# Patient Record
Sex: Male | Born: 1960 | Race: White | Hispanic: No | Marital: Married | Smoking: Never smoker
Health system: Southern US, Community
[De-identification: ages and names within clinical notes are randomized; demographics above are authoritative.]

## PROBLEM LIST (undated history)

## (undated) DIAGNOSIS — K219 Gastro-esophageal reflux disease without esophagitis: Secondary | ICD-10-CM

## (undated) DIAGNOSIS — Z9889 Other specified postprocedural states: Secondary | ICD-10-CM

## (undated) DIAGNOSIS — R112 Nausea with vomiting, unspecified: Secondary | ICD-10-CM

## (undated) DIAGNOSIS — T8859XA Other complications of anesthesia, initial encounter: Secondary | ICD-10-CM

## (undated) DIAGNOSIS — T4145XA Adverse effect of unspecified anesthetic, initial encounter: Secondary | ICD-10-CM

## (undated) HISTORY — DX: Gastro-esophageal reflux disease without esophagitis: K21.9

---

## 2000-11-30 ENCOUNTER — Emergency Department (HOSPITAL_COMMUNITY): Admission: EM | Admit: 2000-11-30 | Discharge: 2000-11-30 | Payer: Self-pay | Admitting: Emergency Medicine

## 2000-11-30 ENCOUNTER — Encounter: Payer: Self-pay | Admitting: Emergency Medicine

## 2001-02-03 ENCOUNTER — Encounter: Payer: Self-pay | Admitting: Internal Medicine

## 2001-02-03 ENCOUNTER — Encounter: Admission: RE | Admit: 2001-02-03 | Discharge: 2001-02-03 | Payer: Self-pay | Admitting: Internal Medicine

## 2001-04-30 ENCOUNTER — Encounter: Payer: Self-pay | Admitting: *Deleted

## 2001-04-30 ENCOUNTER — Encounter: Admission: RE | Admit: 2001-04-30 | Discharge: 2001-04-30 | Payer: Self-pay | Admitting: *Deleted

## 2001-06-04 ENCOUNTER — Encounter (INDEPENDENT_AMBULATORY_CARE_PROVIDER_SITE_OTHER): Payer: Self-pay | Admitting: *Deleted

## 2001-06-04 ENCOUNTER — Ambulatory Visit (HOSPITAL_BASED_OUTPATIENT_CLINIC_OR_DEPARTMENT_OTHER): Admission: RE | Admit: 2001-06-04 | Discharge: 2001-06-05 | Payer: Self-pay | Admitting: Otolaryngology

## 2001-08-06 ENCOUNTER — Encounter (INDEPENDENT_AMBULATORY_CARE_PROVIDER_SITE_OTHER): Payer: Self-pay | Admitting: Specialist

## 2001-08-06 ENCOUNTER — Ambulatory Visit (HOSPITAL_BASED_OUTPATIENT_CLINIC_OR_DEPARTMENT_OTHER): Admission: RE | Admit: 2001-08-06 | Discharge: 2001-08-06 | Payer: Self-pay | Admitting: Otolaryngology

## 2002-11-04 ENCOUNTER — Ambulatory Visit (HOSPITAL_COMMUNITY): Admission: RE | Admit: 2002-11-04 | Discharge: 2002-11-04 | Payer: Self-pay | Admitting: Orthopedic Surgery

## 2003-01-12 ENCOUNTER — Ambulatory Visit (HOSPITAL_COMMUNITY): Admission: RE | Admit: 2003-01-12 | Discharge: 2003-01-12 | Payer: Self-pay | Admitting: Orthopedic Surgery

## 2005-07-28 ENCOUNTER — Encounter: Admission: RE | Admit: 2005-07-28 | Discharge: 2005-07-28 | Payer: Self-pay | Admitting: Internal Medicine

## 2007-04-07 ENCOUNTER — Encounter: Admission: RE | Admit: 2007-04-07 | Discharge: 2007-04-07 | Payer: Self-pay | Admitting: *Deleted

## 2007-04-12 ENCOUNTER — Encounter: Admission: RE | Admit: 2007-04-12 | Discharge: 2007-04-12 | Payer: Self-pay | Admitting: *Deleted

## 2011-01-17 NOTE — Op Note (Signed)
[place]. [facility]  Patient:    MAYS, PAINO Visit Number: [id number] MRN: [id number]          Service Type: DSU Location: [facility] Attending Physician:  Lucky Cowboy Dictated by:   Lucky Cowboy, M.D. Proc. Date: 06/04/01 Admit Date:  06/04/2001   CC:         Darius Bump, M.D.   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  PROCEDURES:  Septoplasty, left maxillary antrotomy with removal of tooth.  SURGEON:  Lucky Cowboy, M.D.  ANESTHESIA:  General.  ESTIMATED BLOOD LOSS:  30 cc.  SPECIMENS:  Left maxillary sinus diseased mucosa.  COMPLICATIONS:  None.  INDICATION:  The patient is a 50 year old male who underwent extraction of a left maxillary molar six months ago.  There was some difficulty with extraction, and the tooth was lodged in the maxillary sinus.  He has had problems with sinusitis since that time and has developed an oroenteral fistula at the extraction site.  A CT scan confirmed these findings with the tooth in the sinus.  FINDINGS:  The patient was noted to have a tooth retained in the left maxillary sinus with severe inflammation and some purulence.  There was pre-existing oroenteral fistula, which was left in place.  There was a prominent left septal deviation, which was causing lateralization of the middle turbinate.  There was an inferior left bony septal spur.  DESCRIPTION OF PROCEDURE:  The patient was taken to the operating room and placed on the table in a supine position.  He was then placed general endotracheal anesthesia and the table rotated counterclockwise 90 degrees. Each nasal cavity was decongested with Afrin on cottonoid sponges.  Lidocaine 1% with 1:100,000 of epinephrine was then used to inject the septum in  the submucoperichondrial plane.  After allowing time for hemostasis, a left hemitransfixion incision was made using a #15 blade.  Mucoperichondrial and mucoperiosteal flaps were elevated using the Therapist, nutritional and suction Cottle.  The bony-cartilaginous junction was divided using a Therapist, nutritional and a contralateral mucoperiosteal flap elevated.  The posterior portion of the quadrangular cartilage and anterior portion of the perpendicular plate of the ethmoid bone and vomer were taken down using an open Jansen-Middleton forceps.  Inferior spur was then dissected out and taken down using pressure to divide the bone with the Therapist, nutritional.  There was no mucosal defect noted.  In this manner, the septum was medialized.  Attention was then turned to the maxillary antrotomy portion of the procedure. The left uncinate process was injected with 1% lidocaine with 1:100,000 of epinephrine.  After allowing time for vasoconstriction, the midportion of the uncinate process was taken down using a pediatric backbiting forceps.  At this point, the microdebrider was used to open up the existing natural antrotomy site.  This was enlarged and inspection using a 30 degree Storz-Hopkins endoscope performed.  Prior to this, a 0 degree Storz-Hopkins endoscope had been used.  Pus and the retained tooth root were extracted from the sinus. The microdebrider was used to take down some of the polypoid tissue in the maxillary sinus; however, the mucosa was not stripped.  At this point, the maxillary sinus was filled with Bactroban ointment and the hemitransfixion incision closed using 4-0 chromic in a simple interrupted fashion.  Doyle splints were placed on either side of the  nasal septum and secured to one another in a horizontal mattress stitch using 2-0 silk suture.  The oral cavity was suctioned and a drip pad placed.  The table was rotated clockwise 90 degrees to its original position.  The patient was  awakened from anesthesia and taken to the postanesthesia care unit in stable condition.  There were no complications.  Return appointment is on June 08, 2001, at 1:30 p.m. for splint removal. Dictated by:   Lucky Cowboy, M.D. Attending Physician:  Lucky Cowboy DD:  06/04/01 TD:  06/05/01 Job: [id number]

## 2011-01-17 NOTE — Op Note (Signed)
   NAME:  Harry Reynolds, Harry Reynolds NO.:  [id number]   MEDICAL RECORD NO.:  [id number]                   PATIENT TYPE:  AMB   LOCATION:  DAY                                  FACILITY:  [facility]   PHYSICIAN:  Marlowe Kays, M.D.               DATE OF BIRTH:  [birth year]   DATE OF PROCEDURE:  11/04/2002  DATE OF DISCHARGE:                                 OPERATIVE REPORT   PREOPERATIVE DIAGNOSIS:  Left carpal tunnel syndrome.   POSTOPERATIVE DIAGNOSIS:  Left carpal tunnel syndrome.   OPERATION:  Decompression of median nerve, left wrist and hand.   SURGEON:  Marlowe Kays, M.D.   ASSISTANT:  Nurse.   ANESTHESIA:  IV regional.   PATHOLOGY AND INDICATIONS FOR PROCEDURE:  Findings and symptoms of severe  carpal tunnel syndrome, confirmed by a nerve conduction study.  She also has  carpal tunnel syndrome on the right side, but only the left is being done  today.   DESCRIPTION OF PROCEDURE:  Satisfactory IV regional anesthesia, Duraprep  from mid forearm to fingertips and then was draped in a sterile field.  With  the marking pen I marked out the curved incision along the base of thenar  imminence, crossing obliquely over flexor crease of the wrist and the distal  forearm.  The palmaris longus tendon was identified and retracted  radialward.  The median nerve was identified beneath it and just proximal to  the wrist there is significant compression of the nerve.  The skin,  subcutaneous tissue and fascia were progressively released into the distal  palm.  The patient also had significant compression in the mid palm.  The  vascular arcade in the mid palm was carefully dissected out with a small  hemostat.  Potential bleeders were coagulated with bipolar cautery  throughout the case.   At the conclusion of the decompression I irrigated the wound well with  sterile saline.  The skin and subcutaneous tissue I closed only with  interrupted 4-0 nylon mattress  sutures.  Betadine, Adaptic dry sterile  dressing and volar plaster splint were applied.  The tourniquet was  released.  He tolerated the procedure well and returned to the recovery room  in satisfactory condition, with no known complications.                                               Marlowe Kays, M.D.    JA/MEDQ  D:  11/04/2002  T:  11/05/2002  Job:  [id number]

## 2011-01-17 NOTE — Op Note (Signed)
   NAME:  Harry Reynolds, Harry Reynolds NO.:  [id number]   MEDICAL RECORD NO.:  [id number]                   PATIENT TYPE:  AMB   LOCATION:  DAY                                  [facility]ACILITY:  [facility]   PHYSICIAN:  Marlowe Kays, M.D.               DATE OF BIRTH:  [birth year]   DATE OF PROCEDURE:  01/12/2003  DATE OF DISCHARGE:                                 OPERATIVE REPORT   PREOPERATIVE DIAGNOSIS:  Right carpal tunnel syndrome, status post left  carpal tunnel release.   POSTOPERATIVE DIAGNOSIS:  Right carpal tunnel syndrome, status post left  carpal tunnel release.   OPERATION:  Decompression of the median nerve, right wrist and hand.   SURGEON:  Illene Labrador. [place], M.D.   ASSISTANT:  Nurse.   ANESTHESIA:  IV regional.   PATHOLOGY AND JUSTIFICATION FOR PROCEDURE:  He has had complete relief with  the release on the left but has numbness and pain consistent with carpal  tunnel syndrome on the right with findings confirmed by abnormal nerve  conduction studies.   DESCRIPTION OF PROCEDURE:  Satisfactory IV regional anesthesia, DuraPrep  from mid forearm to fingertips, and was draped in a sterile field.  I marked  out a curved incision along the base of the thenar eminence, crossing  briefly at the flexor crease of the wrist and the distal forearm.  There was  no palmaris longus tendon identified.  The median nerve had compression  proximal to the wrist as evidenced by loss of vascularity in the nerve.  He  also had significant compression in the mid palm.  Potential bleeders were  coagulated with bipolar cautery.  As I released the skin and fascia into the  distal hand, gently dissecting out the individual branches of the median  nerve.  When I felt that decompression had been completed, I irrigated the  wound well with sterile saline and closed the skin and subcutaneous tissue  only with interrupted 4-0 nylon mattress sutures.  Betadine, Adaptic dry  sterile  dressings, and volar plaster splint were applied.  He tolerated the  procedure well and was taken to the recovery room in satisfactory condition  with no known complications.                                               Marlowe Kays, M.D.    JA/MEDQ  D:  01/12/2003  T:  01/12/2003  Job:  [id number]

## 2011-01-17 NOTE — Op Note (Signed)
[place]. [facility]  Patient:    Harry Reynolds, Harry Reynolds Visit Number: [id number] MRN: [id number]          Service Type: DSU Location: [facility] Attending Physician:  Lucky Cowboy Dictated by:   Lucky Cowboy, M.D. Proc. Date: 08/06/01 Admit Date:  08/06/2001   CC:         Darius Bump, M.D., [facility]   Operative Report  PREOPERATIVE DIAGNOSIS:  Left oroantral fistula.  POSTOPERATIVE DIAGNOSIS:  left oroantral fistula.  PROCEDURE:  Closure of left oroantral fistula with rotation of left palate flap.  SURGEON:  Lucky Cowboy, M.D.  ANESTHESIA:  General endotracheal anesthesia.  ESTIMATED BLOOD LOSS:  300 cc.  SPECIMENS:  Debridement from oroantral fistula site.  COMPLICATIONS:  None.  INDICATION:  The patient is a 50 year old male who has a left oroantral fistula for nine months following dental extraction.  He has since undergone maxillary antrotomy for chronic sinusitis on that side.  He is now ready for closure of the oroantral fistula.  FINDINGS:  The patient was noted to have approximately an 8 mm oroantral bony defect.  Necrotic mucosa, which was inflamed, was removed from the site.  A liquid bone-type replacement was used to fill the defect.  DESCRIPTION OF PROCEDURE:  The patient was taken to the operating room and placed on the table in a supine position.  He was then placed under general endotracheal anesthesia and the table rotated counterclockwise 90 degrees. Bacitracin ointment was placed on the lips and the head and body draped in the usual fashion.  A bite block was placed between the right molars.  The tongue was reflected inferiorly.  A gauze was placed in the oropharynx.  Lidocaine 1% with 1:100,000 of epinephrine was then used to inject the area around the fistula site and also the left side of the hard palate.  After allowing time for vasoconstriction, a #15 and then a #11 blade was then used to make  a circumferential excision of mucosa around the bony defect site.  The mucosa in the sinus was also debrided, as it was felt to be quite edematous.  There was no pus encountered.  Palate flap was then elevated.  It was based on the greater palatine artery.  It comprised almost the entire left portion of the hard palate.  This was incised using a #11 blade.  The flap was then elevated down to the greater palatine foramen using a Public house manager.  Prior to rotating the flap in place, a back-cut was required along the lateral posterior rotation point.  This was performed to prevent bulking up of the tissue in that area.  A calcium sulfate mixture was then placed in the defect site.  The trade name is [id number]-[id number].  This is a bone graft type of material.  It was allowed to solidify within the defect site.  The graft was then placed into position with stay sutures using 4-0 Vicryl.  It was then secured to the surrounding buccal and gingival mucosa.  The oral cavity was then suctioned out.  An NG tube was then placed down the esophagus for suctioning of the gastric contents.  The patient was then awakened from anesthesia and taken to the postanesthesia care unit in stable condition.  There were no complications. Dictated by:   Lucky Cowboy, M.D. Attending Physician:  Lucky Cowboy DD:  08/06/01 TD:  08/07/01 Job: [id number]

## 2013-05-20 ENCOUNTER — Encounter (HOSPITAL_COMMUNITY): Payer: Self-pay | Admitting: *Deleted

## 2013-05-20 ENCOUNTER — Emergency Department (HOSPITAL_COMMUNITY)
Admission: EM | Admit: 2013-05-20 | Discharge: 2013-05-20 | Disposition: A | Discharge status: Home / Self Care | Payer: BC Managed Care – PPO | Attending: Emergency Medicine | Admitting: Emergency Medicine

## 2013-05-20 ENCOUNTER — Emergency Department (HOSPITAL_COMMUNITY): Payer: BC Managed Care – PPO

## 2013-05-20 DIAGNOSIS — IMO0002 Reserved for concepts with insufficient information to code with codable children: Secondary | ICD-10-CM | POA: Insufficient documentation

## 2013-05-20 DIAGNOSIS — Y92410 Unspecified street and highway as the place of occurrence of the external cause: Secondary | ICD-10-CM | POA: Insufficient documentation

## 2013-05-20 DIAGNOSIS — Y9389 Activity, other specified: Secondary | ICD-10-CM | POA: Insufficient documentation

## 2013-05-20 DIAGNOSIS — S298XXA Other specified injuries of thorax, initial encounter: Secondary | ICD-10-CM | POA: Insufficient documentation

## 2013-05-20 DIAGNOSIS — R0789 Other chest pain: Secondary | ICD-10-CM

## 2013-05-20 DIAGNOSIS — S86912A Strain of unspecified muscle(s) and tendon(s) at lower leg level, left leg, initial encounter: Secondary | ICD-10-CM

## 2013-05-20 MED ORDER — CYCLOBENZAPRINE HCL 10 MG PO TABS: 10.0000 mg | ORAL_TABLET | Freq: Two times a day (BID) | ORAL | Status: DC | PRN

## 2013-05-20 MED ORDER — HYDROCODONE-ACETAMINOPHEN 5-325 MG PO TABS: 1.0000 | ORAL_TABLET | ORAL | Status: DC | PRN

## 2013-05-20 MED ORDER — HYDROCODONE-ACETAMINOPHEN 5-325 MG PO TABS
1.0000 | ORAL_TABLET | Freq: Once | ORAL | Status: AC
  Administered 2013-05-20: 1 via ORAL
  Filled 2013-05-20: qty 1

## 2013-05-20 NOTE — ED Provider Notes (Signed)
Medical screening examination/treatment/procedure(s) were performed by non-physician practitioner and as supervising physician I was immediately available for consultation/collaboration.  Deren Degrazia, MD 05/20/13 2028 
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
## 2013-05-20 NOTE — ED Notes (Signed)
Pt seen and evaluated by EDPa for initial assessment. 

## 2013-05-25 ENCOUNTER — Emergency Department (HOSPITAL_COMMUNITY)
Admission: EM | Admit: 2013-05-25 | Discharge: 2013-05-25 | Disposition: A | Discharge status: Home / Self Care | Payer: BC Managed Care – PPO | Attending: Emergency Medicine | Admitting: Emergency Medicine

## 2013-05-25 ENCOUNTER — Emergency Department (HOSPITAL_COMMUNITY): Payer: BC Managed Care – PPO

## 2013-05-25 ENCOUNTER — Encounter (HOSPITAL_COMMUNITY): Payer: Self-pay | Admitting: Emergency Medicine

## 2013-05-25 DIAGNOSIS — G8911 Acute pain due to trauma: Secondary | ICD-10-CM | POA: Insufficient documentation

## 2013-05-25 DIAGNOSIS — M25512 Pain in left shoulder: Secondary | ICD-10-CM

## 2013-05-25 DIAGNOSIS — M25519 Pain in unspecified shoulder: Secondary | ICD-10-CM | POA: Insufficient documentation
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
## 2013-05-25 NOTE — ED Notes (Signed)
Pt seen and evaluated by EDPa for initial assessment. 
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
## 2013-05-26 NOTE — ED Provider Notes (Signed)
Medical screening examination/treatment/procedure(s) were performed by non-physician practitioner and as supervising physician I was immediately available for consultation/collaboration.   Benny Lennert, MD 05/26/13 1415

## 2014-07-29 ENCOUNTER — Emergency Department (HOSPITAL_COMMUNITY)
Admission: EM | Admit: 2014-07-29 | Discharge: 2014-07-29 | Disposition: A | Discharge status: Home / Self Care | Payer: BC Managed Care – PPO | Attending: Emergency Medicine | Admitting: Emergency Medicine

## 2014-07-29 ENCOUNTER — Encounter (HOSPITAL_COMMUNITY)
Admission: EM | Disposition: A | Discharge status: Home / Self Care | Payer: Self-pay | Source: Home / Self Care | Attending: Emergency Medicine

## 2014-07-29 ENCOUNTER — Encounter (HOSPITAL_COMMUNITY): Payer: Self-pay | Admitting: *Deleted

## 2014-07-29 DIAGNOSIS — Z885 Allergy status to narcotic agent status: Secondary | ICD-10-CM | POA: Diagnosis not present

## 2014-07-29 DIAGNOSIS — T18128A Food in esophagus causing other injury, initial encounter: Secondary | ICD-10-CM | POA: Diagnosis not present

## 2014-07-29 DIAGNOSIS — W44F3XA Food entering into or through a natural orifice, initial encounter: Secondary | ICD-10-CM | POA: Insufficient documentation

## 2014-07-29 DIAGNOSIS — X58XXXA Exposure to other specified factors, initial encounter: Secondary | ICD-10-CM | POA: Insufficient documentation

## 2014-07-29 DIAGNOSIS — Y929 Unspecified place or not applicable: Secondary | ICD-10-CM | POA: Insufficient documentation

## 2014-07-29 DIAGNOSIS — T18108A Unspecified foreign body in esophagus causing other injury, initial encounter: Secondary | ICD-10-CM

## 2014-07-29 DIAGNOSIS — K219 Gastro-esophageal reflux disease without esophagitis: Secondary | ICD-10-CM | POA: Diagnosis not present

## 2014-07-29 DIAGNOSIS — R1310 Dysphagia, unspecified: Secondary | ICD-10-CM | POA: Diagnosis present

## 2014-07-29 DIAGNOSIS — Z79899 Other long term (current) drug therapy: Secondary | ICD-10-CM | POA: Diagnosis not present

## 2014-07-29 HISTORY — PX: ESOPHAGOGASTRODUODENOSCOPY: SHX5428

## 2014-07-29 SURGERY — EGD (ESOPHAGOGASTRODUODENOSCOPY)
Anesthesia: Moderate Sedation

## 2014-07-29 SURGERY — CANCELLED PROCEDURE

## 2014-07-29 MED ORDER — ONDANSETRON HCL 4 MG/2ML IJ SOLN
INTRAMUSCULAR | Status: DC | PRN
  Administered 2014-07-29: 4 mg via INTRAVENOUS

## 2014-07-29 MED ORDER — SODIUM CHLORIDE 0.9 % IV SOLN
INTRAVENOUS | Status: DC
  Administered 2014-07-29: 1000 mL via INTRAVENOUS

## 2014-07-29 MED ORDER — FENTANYL CITRATE 0.05 MG/ML IJ SOLN
50.0000 ug | Freq: Once | INTRAMUSCULAR | Status: AC
  Administered 2014-07-29: 50 ug via INTRAVENOUS
  Filled 2014-07-29: qty 2

## 2014-07-29 MED ORDER — MEPERIDINE HCL 100 MG/ML IJ SOLN
INTRAMUSCULAR | Status: AC
  Filled 2014-07-29: qty 2

## 2014-07-29 MED ORDER — MIDAZOLAM HCL 5 MG/5ML IJ SOLN
INTRAMUSCULAR | Status: DC | PRN
  Administered 2014-07-29: 2 mg via INTRAVENOUS

## 2014-07-29 MED ORDER — STERILE WATER FOR IRRIGATION IR SOLN
Status: DC | PRN
  Administered 2014-07-29: 21:00:00

## 2014-07-29 MED ORDER — MEPERIDINE HCL 100 MG/ML IJ SOLN
INTRAMUSCULAR | Status: DC | PRN
  Administered 2014-07-29: 50 mg via INTRAVENOUS

## 2014-07-29 MED ORDER — BUTAMBEN-TETRACAINE-BENZOCAINE 2-2-14 % EX AERO
INHALATION_SPRAY | CUTANEOUS | Status: DC | PRN
  Administered 2014-07-29: 3 via TOPICAL

## 2014-07-29 MED ORDER — LIDOCAINE VISCOUS 2 % MT SOLN
OROMUCOSAL | Status: AC
  Filled 2014-07-29: qty 15

## 2014-07-29 MED ORDER — ONDANSETRON HCL 4 MG/2ML IJ SOLN
INTRAMUSCULAR | Status: AC
  Filled 2014-07-29: qty 2

## 2014-07-29 MED ORDER — MIDAZOLAM HCL 5 MG/5ML IJ SOLN
INTRAMUSCULAR | Status: AC
  Filled 2014-07-29: qty 10

## 2014-07-29 NOTE — ED Notes (Signed)
Pt informed that Dr Gala Romney is coming in.

## 2014-07-29 NOTE — ED Notes (Signed)
Pt states was eating some beef about 2 hours ago & now has something stuck in throat. Pt attempted to drink & vomited as soon as he drink it. Pt states feel like it stays in the same place.

## 2014-07-29 NOTE — Discharge Instructions (Addendum)
EGD Discharge instructions Please read the instructions outlined below and refer to this sheet in the next few weeks. These discharge instructions provide you with general information on caring for yourself after you leave the hospital. Your doctor may also give you specific instructions. While your treatment has been planned according to the most current medical practices available, unavoidable complications occasionally occur. If you have any problems or questions after discharge, please call your doctor. ACTIVITY  You may resume your regular activity but move at a slower pace for the next 24 hours.   Take frequent rest periods for the next 24 hours.   Walking will help expel (get rid of) the air and reduce the bloated feeling in your abdomen.   No driving for 24 hours (because of the anesthesia (medicine) used during the test).   You may shower.   Do not sign any important legal documents or operate any machinery for 24 hours (because of the anesthesia used during the test).  NUTRITION  Drink plenty of fluids.   You may resume your normal diet.   Begin with a light meal and progress to your normal diet.   Avoid alcoholic beverages for 24 hours or as instructed by your caregiver.  MEDICATIONS  You may resume your normal medications unless your caregiver tells you otherwise.  WHAT YOU CAN EXPECT TODAY  You may experience abdominal discomfort such as a feeling of fullness or gas pains.  FOLLOW-UP  Your doctor will discuss the results of your test with you.  SEEK IMMEDIATE MEDICAL ATTENTION IF ANY OF THE FOLLOWING OCCUR:  Excessive nausea (feeling sick to your stomach) and/or vomiting.   Severe abdominal pain and distention (swelling).   Trouble swallowing.   Temperature over 101 F (37.8 C).   Rectal bleeding or vomiting of blood.    GERD information provided  Soft diet until you can have elective EGD and have your esophagus dilated as appropriate. Would avoid meat  and bread for now.  My office will contact you regarding scheduling an elective endoscopy in the coming weeks.  Gastroesophageal Reflux Disease, Adult Gastroesophageal reflux disease (GERD) happens when acid from your stomach flows up into the esophagus. When acid comes in contact with the esophagus, the acid causes soreness (inflammation) in the esophagus. Over time, GERD may create small holes (ulcers) in the lining of the esophagus. CAUSES   Increased body weight. This puts pressure on the stomach, making acid rise from the stomach into the esophagus.  Smoking. This increases acid production in the stomach.  Drinking alcohol. This causes decreased pressure in the lower esophageal sphincter (valve or ring of muscle between the esophagus and stomach), allowing acid from the stomach into the esophagus.  Late evening meals and a full stomach. This increases pressure and acid production in the stomach.  A malformed lower esophageal sphincter. Sometimes, no cause is found. SYMPTOMS   Burning pain in the lower part of the mid-chest behind the breastbone and in the mid-stomach area. This may occur twice a week or more often.  Trouble swallowing.  Sore throat.  Dry cough.  Asthma-like symptoms including chest tightness, shortness of breath, or wheezing. DIAGNOSIS  Your caregiver may be able to diagnose GERD based on your symptoms. In some cases, X-rays and other tests may be done to check for complications or to check the condition of your stomach and esophagus. TREATMENT  Your caregiver may recommend over-the-counter or prescription medicines to help decrease acid production. Ask your caregiver before starting  or adding any new medicines.  HOME CARE INSTRUCTIONS   Change the factors that you can control. Ask your caregiver for guidance concerning weight loss, quitting smoking, and alcohol consumption.  Avoid foods and drinks that make your symptoms worse, such as:  Caffeine or  alcoholic drinks.  Chocolate.  Peppermint or mint flavorings.  Garlic and onions.  Spicy foods.  Citrus fruits, such as oranges, lemons, or limes.  Tomato-based foods such as sauce, chili, salsa, and pizza.  Fried and fatty foods.  Avoid lying down for the 3 hours prior to your bedtime or prior to taking a nap.  Eat small, frequent meals instead of large meals.  Wear loose-fitting clothing. Do not wear anything tight around your waist that causes pressure on your stomach.  Raise the head of your bed 6 to 8 inches with wood blocks to help you sleep. Extra pillows will not help.  Only take over-the-counter or prescription medicines for pain, discomfort, or fever as directed by your caregiver.  Do not take aspirin, ibuprofen, or other nonsteroidal anti-inflammatory drugs (NSAIDs). SEEK IMMEDIATE MEDICAL CARE IF:   You have pain in your arms, neck, jaw, teeth, or back.  Your pain increases or changes in intensity or duration.  You develop nausea, vomiting, or sweating (diaphoresis).  You develop shortness of breath, or you faint.  Your vomit is green, yellow, black, or looks like coffee grounds or blood.  Your stool is red, bloody, or black. These symptoms could be signs of other problems, such as heart disease, gastric bleeding, or esophageal bleeding. MAKE SURE YOU:   Understand these instructions.  Will watch your condition.  Will get help right away if you are not doing well or get worse. Document Released: 05/28/2005 Document Revised: 11/10/2011 Document Reviewed: 03/07/2011 [facility] Patient Information 2015 [place], [place]. This information is not intended to replace advice given to you by your health care provider. Make sure you discuss any questions you have with your health care provider.  Soft-Food Meal Plan A soft-food meal plan includes foods that are safe and easy to swallow. This meal plan typically is used:  If you are having trouble chewing or  swallowing foods.  As a transition meal plan after only having had liquid meals for a long period. WHAT DO I NEED TO KNOW ABOUT THE SOFT-FOOD MEAL PLAN? A soft-food meal plan includes tender foods that are soft and easy to chew and swallow. In most cases, bite-sized pieces of food are easier to swallow. A bite-sized piece is about  inch or smaller. Foods in this plan do not need to be ground or pureed. Foods that are very hard, crunchy, or sticky should be avoided. Also, breads, cereals, yogurts, and desserts with nuts, seeds, or fruits should be avoided. WHAT FOODS CAN I EAT? Grains Rice and wild rice. Moist bread, dressing, pasta, and noodles. Well-moistened dry or cooked cereals, such as farina (cooked wheat cereal), oatmeal, or grits. Biscuits, breads, muffins, pancakes, and waffles that have been well moistened. Vegetables Shredded lettuce. Cooked, tender vegetables, including potatoes without skins. Vegetable juices. Broths or creamed soups made with vegetables that are not stringy or chewy. Strained tomatoes (without seeds). Fruits Canned or well-cooked fruits. Soft (ripe), peeled fresh fruits, such as peaches, nectarines, kiwi, cantaloupe, honeydew melon, and watermelon (without seeds). Soft berries with small seeds, such as strawberries. Fruit juices (without pulp). Meats and Other Protein Sources Moist, tender, lean beef. Mutton. Lamb. Veal. Chicken. [place]. Liver. Ham. Fish without bones. Eggs. Dairy Milk,  milk drinks, and cream. Plain cream cheese and cottage cheese. Plain yogurt. Sweets/Desserts Flavored gelatin desserts. Custard. Plain ice cream, frozen yogurt, sherbet, milk shakes, and malts. Plain cakes and cookies. Plain hard candy.  Other Butter, margarine (without trans fat), and cooking oils. Mayonnaise. Cream sauces. Mild spices, salt, and sugar. Syrup, molasses, honey, and jelly. The items listed above may not be a complete list of recommended foods or beverages. Contact  your dietitian for more options. WHAT FOODS ARE NOT RECOMMENDED? Grains Dry bread, toast, crackers that have not been moistened. Coarse or dry cereals, such as bran, granola, and shredded wheat. Tough or chewy crusty breads, such as [place] bread or baguettes. Vegetables Corn. Raw vegetables except shredded lettuce. Cooked vegetables that are tough or stringy. Tough, crisp, fried potatoes and potato skins. Fruits Fresh fruits with skins or seeds or both, such as apples, pears, or grapes. Stringy, high-pulp fruits, such as papaya, pineapple, coconut, or mango. Fruit leather, fruit roll-ups, and all dried fruits. Meats and Other Protein Sources Sausages and hot dogs. Meats with gristle. Fish with bones. Nuts, seeds, and chunky peanut or other nut butters. Sweets/Desserts Cakes or cookies that are very dry or chewy.  The items listed above may not be a complete list of foods and beverages to avoid. Contact your dietitian for more information. Document Released: 11/25/2007 Document Revised: 08/23/2013 Document Reviewed: 07/15/2013 [employer] Patient Information 2015 [place], [place]. This information is not intended to replace advice given to you by your health care provider. Make sure you discuss any questions you have with your health care provider.

## 2014-07-29 NOTE — ED Provider Notes (Signed)
CSN: [id number]     Arrival date & time 07/29/14  1931 History   First MD Initiated Contact with Patient 07/29/14 1939     Chief Complaint  Patient presents with  . Dysphagia      HPI Pt was seen at [facility]. Per pt and his family, c/o sudden onset and persistence of constant FB in esophagus that began approximately 2 hours PTA. Pt states he was "eating beef" when "it got stuck" in his lower chest area. States he is unable to tol PO: "it just comes up again." States he has experienced similar symptoms previously but "was able to pass it on my own." States he has had "problems with reflux" for over the past year, frequently taking OTC Tums to treat his symptoms.  Pt has not been evaluated by a PMD or GI MD for his symptoms. Denies syncope, no hematemesis, no black or blood in stools, no SOB.    History reviewed. No pertinent past medical history.   History reviewed. No pertinent past surgical history.  History  Substance Use Topics  . Smoking status: Never Smoker   . Smokeless tobacco: Never Used  . Alcohol Use: No    Review of Systems ROS: Statement: All systems negative except as marked or noted in the HPI; Constitutional: Negative for fever and chills. ; ; Eyes: Negative for eye pain, redness and discharge. ; ; ENMT: Negative for ear pain, hoarseness, nasal congestion, sinus pressure and sore throat. ; ; Cardiovascular: Negative for chest pain, palpitations, diaphoresis, dyspnea and peripheral edema. ; ; Respiratory: Negative for cough, wheezing and stridor. ; ; Gastrointestinal: +FB esophagus. Negative for nausea, vomiting, diarrhea, abdominal pain, blood in stool, hematemesis, jaundice and rectal bleeding. . ; ; Genitourinary: Negative for dysuria, flank pain and hematuria. ; ; Musculoskeletal: Negative for back pain and neck pain. Negative for swelling and trauma.; ; Skin: Negative for pruritus, rash, abrasions, blisters, bruising and skin lesion.; ; Neuro: Negative for headache,  lightheadedness and neck stiffness. Negative for weakness, altered level of consciousness , altered mental status, extremity weakness, paresthesias, involuntary movement, seizure and syncope.     Allergies  Codeine and Other  Home Medications   Prior to Admission medications   Medication Sig Start Date End Date Taking? Authorizing Provider  cyclobenzaprine (FLEXERIL) 10 MG tablet Take 1 tablet (10 mg total) by mouth 2 (two) times daily as needed for muscle spasms. Patient not taking: Reported on 07/29/2014 05/20/13   Idalia Needle. Sanford, PA-C   BP 127/111 mmHg  Pulse 92  Temp(Src) 97.8 F (36.6 C) (Oral)  Resp 20  Ht 5\' 8"  (1.727 m)  Wt 199 lb (90.266 kg)  BMI 30.26 kg/m2  SpO2 98% Physical Exam  1950: Physical examination:  Nursing notes reviewed; Vital signs and O2 SAT reviewed;  Constitutional: Well developed, Well nourished, Well hydrated, Uncomfortable appearing.; Head:  Normocephalic, atraumatic; Eyes: EOMI, PERRL, No scleral icterus; ENMT: Mouth and pharynx normal, Mucous membranes moist; Neck: Supple, Full range of motion, No lymphadenopathy; Cardiovascular: Regular rate and rhythm, No murmur, rub, or gallop; Respiratory: Breath sounds clear & equal bilaterally, No rales, rhonchi, wheezes.  Speaking full sentences with ease, Normal respiratory effort/excursion; Chest: Nontender, Movement normal; Abdomen: Soft, Nontender, Nondistended, Normal bowel sounds; Genitourinary: No CVA tenderness; Extremities: Pulses normal, No tenderness, No edema, No calf edema or asymmetry.; Neuro: AA&Ox3, Major CN grossly intact.  Speech clear. No gross focal motor or sensory deficits in extremities. Climbs on and off stretcher easily by himself. Gait steady.;  Skin: Color normal, Warm, Dry.   ED Course  Procedures    MDM  MDM Reviewed: nursing note and vitals     2000:  Pt took a sip of water and immediately regurgitated it.  T/C to GI Dr. Gala Romney, case discussed, including:  HPI, pertinent  PM/SHx, VS/PE, dx testing, ED course and treatment:  Agreeable to perform EGD tonight.  Pt and family updated.    Francine Graven, DO 07/31/14 [phone]

## 2014-07-29 NOTE — H&P (Signed)
@LOGO @   Primary Care Physician:  No PCP Per Patient Primary Gastroenterologist:  Dr. Gala Romney  Pre-Procedure History & Physical: HPI:  Harry Reynolds is a 53 y.o. male here for probable food impaction. Was eating meat 4 hours ago swallowed his first piece and felt it stick minus breast bone. Has not been able to swallow his saliva since. Dr. Thurnell Garbe saw him in the ED and called me. He's had long-standing intermittent issues with esophageal dysphagia. An long, long history of GERD. Takes Tums regularly. Does not see any physicians. No prior GI evaluation. No prior history of gastrointestinal illness as far she knows.  No PCP.  History reviewed. No pertinent past medical history.  History reviewed. No pertinent past surgical history.  Prior to Admission medications   Medication Sig Start Date End Date Taking? Authorizing Provider  cyclobenzaprine (FLEXERIL) 10 MG tablet Take 1 tablet (10 mg total) by mouth 2 (two) times daily as needed for muscle spasms. Patient not taking: Reported on 07/29/2014 05/20/13   Idalia Needle. Sanford, PA-C    Allergies as of 07/29/2014 - Review Complete 07/29/2014  Allergen Reaction Noted  . Codeine Nausea And Vomiting 05/20/2013  . Other Nausea And Vomiting 07/29/2014    History reviewed. No pertinent family history.  History   Social History  . Marital Status: Married    Spouse Name: N/A    Number of Children: N/A  . Years of Education: N/A   Occupational History  . Not on file.   Social History Main Topics  . Smoking status: Never Smoker   . Smokeless tobacco: Never Used  . Alcohol Use: No  . Drug Use: No  . Sexual Activity: Not on file   Other Topics Concern  . Not on file   Social History Narrative    Review of Systems: See HPI, otherwise negative ROS  Physical Exam: BP 127/111 mmHg  Pulse 92  Temp(Src) 97.8 F (36.6 C) (Oral)  Resp 20  Ht 5\' 8"  (1.727 m)  Wt 199 lb (90.266 kg)  BMI 30.26 kg/m2  SpO2 98% General:   Alert,  ,  pleasant and cooperative in NAD. Expectorating saliva into an emesis basin. Skin:  Intact without significant lesions or rashes. Eyes:  Sclera clear, no icterus.   Conjunctiva pink. Ears:  Normal auditory acuity. Nose:  No deformity, discharge,  or lesions. Mouth:  No deformity or lesions. Neck:  Supple; no masses or thyromegaly. No significant cervical adenopathy. Lungs:  Clear throughout to auscultation.   No wheezes, crackles, or rhonchi. No acute distress. Heart:  Regular rate and rhythm; no murmurs, clicks, rubs,  or gallops. Abdomen: Non-distended, normal bowel sounds.  Soft and nontender without appreciable mass or hepatosplenomegaly.  Pulses:  Normal pulses noted. Extremities:  Without clubbing or edema.  Impression/Plan: 53 year old gentleman with the signs and symptoms consistent with esophageal food impaction. Has chronic esophageal dysphagia. Has frequent GERD  -   long-standing with no prior workup. He likely has a food impaction.  I've offered him an emergent EGD with disimpaction. I told him I would not likely be able to dilate his esophagus this evening. The plan would be for him to return in elective setting to complete an EGD and dilated esophagus as appropriate.The risks, benefits, limitations, alternatives and imponderables have been reviewed with the patient. Potential for esophageal dilation, biopsy, etc. have also been reviewed.  Questions have been answered. All parties agreeable.    Notice: This dictation was prepared with Dragon dictation along with  smaller phrase technology. Any transcriptional errors that result from this process are unintentional and may not be corrected upon review.

## 2014-07-29 NOTE — ED Notes (Signed)
Pt c/o difficulty swallowing; pt states he ate a piece of meat and it's stuck; pt states he has had the same problem in the past

## 2014-07-31 NOTE — Op Note (Signed)
[facility] [address] [place], [address]   ENDOSCOPY PROCEDURE REPORT  PATIENT: Harry Reynolds, Harry Reynolds  MR#: [id number] BIRTHDATE: 1960/09/06 , 53  yrs. old GENDER: male ENDOSCOPIST: R.  Garfield Cornea, MD FACP [facility] REFERRED BY:     none PROCEDURE DATE:  08-20-2014 PROCEDURE:  EGD w/ fb removal INDICATIONS:  food impaction. MEDICATIONS: Versed 2 mg IV and Demerol 50 mg IV in divided doses. Zofran 4 mg IV.  Cetacaine spray orally. ASA CLASS:      Class II  CONSENT: The risks, benefits, limitations, alternatives and imponderables have been discussed.  The potential for biopsy, esophogeal dilation, etc. have also been reviewed.  Questions have been answered.  All parties agreeable.  Please see the history and physical in the medical record for more information.  DESCRIPTION OF PROCEDURE: After the risks benefits and alternatives of the procedure were thoroughly explained, informed consent was obtained.  The [id number] ([id number]) endoscope was introduced through the mouth andum limited by Limited by an obstruction. .    Meat bolus ball valving in the distal esophagus.  There is a question of a ringed appearance and possible furring of the distal esophagus.  I gently placed the tip of the gastroscope against the meat bolus and applied gentle pressure, pushing it into the stomach.  No attempts at complete examination carried out this evening per plan.  Retroflexion was not performed.     The scope was then withdrawn from the patient and the procedure completed.  COMPLICATIONS: There were no immediate complications.  ENDOSCOPIC IMPRESSION: Limited EGD; food impaction; status post disimpaction as described above.  RECOMMENDATIONS: Begin Protonix 40 mg daily. Avoid meats and bread. Swallowing precautions reviewed. Will invite him back for elective EGD with dilation, etc. as appropriate.  REPEAT EXAM:  eSigned:  R. Garfield Cornea, MD Rosalita Chessman [facility] 2014/08/20 9:32  PM    CC:  CPT CODES: ICD CODES:  The ICD and CPT codes recommended by this software are interpretations from the data that the clinical staff has captured with the software.  The verification of the translation of this report to the ICD and CPT codes and modifiers is the sole responsibility of the health care institution and practicing physician where this report was generated.  [employer]. will not be held responsible for the validity of the ICD and CPT codes included on this report.  AMA assumes no liability for data contained or not contained herein. CPT is a Designer, television/film set of the [employer].

## 2014-08-01 ENCOUNTER — Other Ambulatory Visit: Payer: Self-pay

## 2014-08-01 DIAGNOSIS — K2100 Gastro-esophageal reflux disease with esophagitis, without bleeding: Secondary | ICD-10-CM

## 2014-08-01 DIAGNOSIS — Z1211 Encounter for screening for malignant neoplasm of colon: Secondary | ICD-10-CM

## 2014-08-01 MED ORDER — PEG-KCL-NACL-NASULF-NA ASC-C 100 G PO SOLR: 1.0000 | ORAL | Status: DC

## 2014-08-02 ENCOUNTER — Telehealth: Payer: Self-pay

## 2014-08-02 NOTE — Telephone Encounter (Signed)
Instructions are in the mail and patient is aware of date and time.

## 2014-08-02 NOTE — Telephone Encounter (Signed)
-----   Message from Daneil Dolin, MD sent at 08/01/2014  9:40 PM EST ----- yes ----- Message -----    From: Marlou Porch, CMA    Sent: 08/01/2014   3:25 PM      To: Daneil Dolin, MD  I have him set up for 08/14/14 to do the EGD and TCS. Would the TCS before a screening?  Thanks Saffron Busey  ----- Message -----    From: Daneil Dolin, MD    Sent: 07/29/2014   9:39 PM      To: Marlou Porch, CMA  Patient in this evening with food impaction. He needs an elective EGD in about 2 weeks to further assess and dilate as appropriate. He is 89 and never had a colonoscopy. I would offer him a colonoscopy at the same time. Thanks.

## 2014-08-04 ENCOUNTER — Encounter (HOSPITAL_COMMUNITY): Payer: Self-pay | Admitting: Internal Medicine

## 2014-08-14 ENCOUNTER — Encounter (HOSPITAL_COMMUNITY)
Admission: RE | Disposition: A | Discharge status: Home / Self Care | Payer: Self-pay | Source: Ambulatory Visit | Attending: Internal Medicine

## 2014-08-14 ENCOUNTER — Encounter (HOSPITAL_COMMUNITY): Payer: Self-pay

## 2014-08-14 ENCOUNTER — Ambulatory Visit (HOSPITAL_COMMUNITY)
Admission: RE | Admit: 2014-08-14 | Discharge: 2014-08-14 | Disposition: A | Discharge status: Home / Self Care | Payer: BC Managed Care – PPO | Source: Ambulatory Visit | Attending: Internal Medicine | Admitting: Internal Medicine

## 2014-08-14 DIAGNOSIS — D120 Benign neoplasm of cecum: Secondary | ICD-10-CM | POA: Insufficient documentation

## 2014-08-14 DIAGNOSIS — K2100 Gastro-esophageal reflux disease with esophagitis, without bleeding: Secondary | ICD-10-CM

## 2014-08-14 DIAGNOSIS — K219 Gastro-esophageal reflux disease without esophagitis: Secondary | ICD-10-CM | POA: Insufficient documentation

## 2014-08-14 DIAGNOSIS — Z1211 Encounter for screening for malignant neoplasm of colon: Secondary | ICD-10-CM | POA: Diagnosis present

## 2014-08-14 DIAGNOSIS — K200 Eosinophilic esophagitis: Secondary | ICD-10-CM | POA: Diagnosis not present

## 2014-08-14 DIAGNOSIS — R1319 Other dysphagia: Secondary | ICD-10-CM | POA: Diagnosis not present

## 2014-08-14 DIAGNOSIS — Z860100 Personal history of colon polyps, unspecified: Secondary | ICD-10-CM | POA: Insufficient documentation

## 2014-08-14 DIAGNOSIS — K449 Diaphragmatic hernia without obstruction or gangrene: Secondary | ICD-10-CM | POA: Insufficient documentation

## 2014-08-14 DIAGNOSIS — K229 Disease of esophagus, unspecified: Secondary | ICD-10-CM

## 2014-08-14 DIAGNOSIS — R1314 Dysphagia, pharyngoesophageal phase: Secondary | ICD-10-CM

## 2014-08-14 DIAGNOSIS — K635 Polyp of colon: Secondary | ICD-10-CM

## 2014-08-14 HISTORY — DX: Adverse effect of unspecified anesthetic, initial encounter: T41.45XA

## 2014-08-14 HISTORY — DX: Other specified postprocedural states: Z98.890

## 2014-08-14 HISTORY — DX: Other complications of anesthesia, initial encounter: T88.59XA

## 2014-08-14 HISTORY — DX: Other specified postprocedural states: R11.2

## 2014-08-14 HISTORY — PX: ESOPHAGEAL DILATION: SHX303

## 2014-08-14 HISTORY — PX: ESOPHAGOGASTRODUODENOSCOPY: SHX5428

## 2014-08-14 HISTORY — PX: COLONOSCOPY: SHX5424

## 2014-08-14 SURGERY — COLONOSCOPY
Anesthesia: Moderate Sedation

## 2014-08-14 MED ORDER — ONDANSETRON HCL 4 MG/2ML IJ SOLN
INTRAMUSCULAR | Status: DC | PRN
  Administered 2014-08-14: 4 mg via INTRAVENOUS

## 2014-08-14 MED ORDER — SODIUM CHLORIDE 0.9 % IV SOLN
INTRAVENOUS | Status: DC
  Administered 2014-08-14: 13:00:00 via INTRAVENOUS

## 2014-08-14 MED ORDER — ONDANSETRON HCL 4 MG/2ML IJ SOLN
INTRAMUSCULAR | Status: AC
  Filled 2014-08-14: qty 2

## 2014-08-14 MED ORDER — LIDOCAINE VISCOUS 2 % MT SOLN
OROMUCOSAL | Status: AC
  Filled 2014-08-14: qty 15

## 2014-08-14 MED ORDER — MEPERIDINE HCL 100 MG/ML IJ SOLN
INTRAMUSCULAR | Status: DC | PRN
  Administered 2014-08-14 (×2): 50 mg

## 2014-08-14 MED ORDER — LIDOCAINE VISCOUS 2 % MT SOLN
OROMUCOSAL | Status: DC | PRN
  Administered 2014-08-14: 1 via OROMUCOSAL

## 2014-08-14 MED ORDER — MIDAZOLAM HCL 5 MG/5ML IJ SOLN
INTRAMUSCULAR | Status: AC
  Filled 2014-08-14: qty 10

## 2014-08-14 MED ORDER — MIDAZOLAM HCL 5 MG/5ML IJ SOLN
INTRAMUSCULAR | Status: DC | PRN
  Administered 2014-08-14 (×2): 2 mg via INTRAVENOUS

## 2014-08-14 MED ORDER — MEPERIDINE HCL 100 MG/ML IJ SOLN
INTRAMUSCULAR | Status: AC
  Filled 2014-08-14: qty 2

## 2014-08-14 NOTE — Op Note (Signed)
[facility] [address] [place], [address]   ENDOSCOPY PROCEDURE REPORT  PATIENT: Harry Reynolds, Harry Reynolds  MR#: [id number] BIRTHDATE: Feb 10, 1961 , 53  yrs. old GENDER: male ENDOSCOPIST: R.  Garfield Cornea, MD [facility] REFERRED BY:     Dr. Thurnell Garbe PROCEDURE DATE:  08-27-14 PROCEDURE:  EGD w/ biopsy and Venia Minks dilation of esophagus INDICATIONS:  recent food impaction; chronic GERD and esophageal dysphagia. MEDICATIONS: Versed 4 mg IV and Demerol 100 mg IV in divided doses. Xylocaine gel orally.  Zofran 4 mg IV. ASA CLASS:      Class II  CONSENT: The risks, benefits, limitations, alternatives and imponderables have been discussed.  The potential for biopsy, esophogeal dilation, etc. have also been reviewed.  Questions have been answered.  All parties agreeable.  Please see the history and physical in the medical record for more information.  DESCRIPTION OF PROCEDURE: After the risks benefits and alternatives of the procedure were thoroughly explained, informed consent was obtained.  The [id number] ([id number]) endoscope was introduced through the mouth and advanced to the second portion of the duodenum , limited by Without limitations. The instrument was slowly withdrawn as the mucosa was fully examined.    Circumferential longitudinal furrows present in the distal and mid esophagus.  Some "ringed" appearance of the tubular esophagus.  No Barrett's esophagus.  No tumor.  The tubular esophagus was patent throughout its course.  Stomach empty.  Small hiatal hernia. Normal gastric mucosa.  Pylorus patent..  Normal first and second portion of the duodenum.  Retroflexed views revealed as previously described.  A 52 French Maloney dilator was  passed to full insertion with mild resistance. A look back revealed a small tear in the distal esophagus. Larger bore dilation was not attempted,, being concerned  for the presence of EOE.  Subsequently, the mid and distal esophagus  biopsied for histologic study. The scope was then withdrawn from the patient and the procedure completed.  COMPLICATIONS: There were no immediate complications.  ENDOSCOPIC IMPRESSION: Abnormal esophagus?"concerning for EOE. Status post passage of a Maloney dilator and subsequent biopsy. Small hiatal hernia; otherwise, normal EGD.  RECOMMENDATIONS: Continue Protonix 40 mg daily. Follow up on pathology. See colonoscopy report.  REPEAT EXAM:  eSigned:  R. Garfield Cornea, MD [facility] 08-27-14 1:50 PM    CC:  CPT CODES: ICD CODES:  The ICD and CPT codes recommended by this software are interpretations from the data that the clinical staff has captured with the software.  The verification of the translation of this report to the ICD and CPT codes and modifiers is the sole responsibility of the health care institution and practicing physician where this report was generated.  [employer] will not be held responsible for the validity of the ICD and CPT codes included on this report.  AMA assumes no liability for data contained or not contained herein. CPT is a Designer, television/film set of the [employer].  PATIENT NAME:  [name], [name] MR#: [id number]

## 2014-08-14 NOTE — Discharge Instructions (Addendum)
Colonoscopy Discharge Instructions  Read the instructions outlined below and refer to this sheet in the next few weeks. These discharge instructions provide you with general information on caring for yourself after you leave the hospital. Your doctor may also give you specific instructions. While your treatment has been planned according to the most current medical practices available, unavoidable complications occasionally occur. If you have any problems or questions after discharge, call Dr. Gala Reynolds at [phone]. ACTIVITY  You may resume your regular activity, but move at a slower pace for the next 24 hours.   Take frequent rest periods for the next 24 hours.   Walking will help get rid of the air and reduce the bloated feeling in your belly (abdomen).   No driving for 24 hours (because of the medicine (anesthesia) used during the test).    Do not sign any important legal documents or operate any machinery for 24 hours (because of the anesthesia used during the test).  NUTRITION  Drink plenty of fluids.   You may resume your normal diet as instructed by your doctor.   Begin with a light meal and progress to your normal diet. Heavy or fried foods are harder to digest and may make you feel sick to your stomach (nauseated).   Avoid alcoholic beverages for 24 hours or as instructed.  MEDICATIONS  You may resume your normal medications unless your doctor tells you otherwise.  WHAT YOU CAN EXPECT TODAY  Some feelings of bloating in the abdomen.   Passage of more gas than usual.   Spotting of blood in your stool or on the toilet paper.  IF YOU HAD POLYPS REMOVED DURING THE COLONOSCOPY:  No aspirin products for 7 days or as instructed.   No alcohol for 7 days or as instructed.   Eat a soft diet for the next 24 hours.  FINDING OUT THE RESULTS OF YOUR TEST Not all test results are available during your visit. If your test results are not back during the visit, make an appointment  with your caregiver to find out the results. Do not assume everything is normal if you have not heard from your caregiver or the medical facility. It is important for you to follow up on all of your test results.  SEEK IMMEDIATE MEDICAL ATTENTION IF:  You have more than a spotting of blood in your stool.   Your belly is swollen (abdominal distention).   You are nauseated or vomiting.   You have a temperature over 101.   You have abdominal pain or discomfort that is severe or gets worse throughout the day. \  EGD Discharge instructions Please read the instructions outlined below and refer to this sheet in the next few weeks. These discharge instructions provide you with general information on caring for yourself after you leave the hospital. Your doctor may also give you specific instructions. While your treatment has been planned according to the most current medical practices available, unavoidable complications occasionally occur. If you have any problems or questions after discharge, please call your doctor. ACTIVITY  You may resume your regular activity but move at a slower pace for the next 24 hours.   Take frequent rest periods for the next 24 hours.   Walking will help expel (get rid of) the air and reduce the bloated feeling in your abdomen.   No driving for 24 hours (because of the anesthesia (medicine) used during the test).   You may shower.   Do not sign any  important legal documents or operate any machinery for 24 hours (because of the anesthesia used during the test).  NUTRITION  Drink plenty of fluids.   You may resume your normal diet.   Begin with a light meal and progress to your normal diet.   Avoid alcoholic beverages for 24 hours or as instructed by your caregiver.  MEDICATIONS  You may resume your normal medications unless your caregiver tells you otherwise.  WHAT YOU CAN EXPECT TODAY  You may experience abdominal discomfort such as a feeling of  fullness or gas pains.  FOLLOW-UP  Your doctor will discuss the results of your test with you.  SEEK IMMEDIATE MEDICAL ATTENTION IF ANY OF THE FOLLOWING OCCUR:  Excessive nausea (feeling sick to your stomach) and/or vomiting.   Severe abdominal pain and distention (swelling).   Trouble swallowing.   Temperature over 101 F (37.8 C).   Rectal bleeding or vomiting of blood.    GERD and polyp information provided  Continue Protonix 40 mg daily  Office visit with [place] in 3 months  Further recommendations to follow pending review of pathology report  Gastroesophageal Reflux Disease, Adult Gastroesophageal reflux disease (GERD) happens when acid from your stomach flows up into the esophagus. When acid comes in contact with the esophagus, the acid causes soreness (inflammation) in the esophagus. Over time, GERD may create small holes (ulcers) in the lining of the esophagus. CAUSES   Increased body weight. This puts pressure on the stomach, making acid rise from the stomach into the esophagus.  Smoking. This increases acid production in the stomach.  Drinking alcohol. This causes decreased pressure in the lower esophageal sphincter (valve or ring of muscle between the esophagus and stomach), allowing acid from the stomach into the esophagus.  Late evening meals and a full stomach. This increases pressure and acid production in the stomach.  A malformed lower esophageal sphincter. Sometimes, no cause is found. SYMPTOMS   Burning pain in the lower part of the mid-chest behind the breastbone and in the mid-stomach area. This may occur twice a week or more often.  Trouble swallowing.  Sore throat.  Dry cough.  Asthma-like symptoms including chest tightness, shortness of breath, or wheezing. DIAGNOSIS  Your caregiver may be able to diagnose GERD based on your symptoms. In some cases, X-rays and other tests may be done to check for complications or to check the condition of your  stomach and esophagus. TREATMENT  Your caregiver may recommend over-the-counter or prescription medicines to help decrease acid production. Ask your caregiver before starting or adding any new medicines.  HOME CARE INSTRUCTIONS   Change the factors that you can control. Ask your caregiver for guidance concerning weight loss, quitting smoking, and alcohol consumption.  Avoid foods and drinks that make your symptoms worse, such as:  Caffeine or alcoholic drinks.  Chocolate.  Peppermint or mint flavorings.  Garlic and onions.  Spicy foods.  Citrus fruits, such as oranges, lemons, or limes.  Tomato-based foods such as sauce, chili, salsa, and pizza.  Fried and fatty foods.  Avoid lying down for the 3 hours prior to your bedtime or prior to taking a nap.  Eat small, frequent meals instead of large meals.  Wear loose-fitting clothing. Do not wear anything tight around your waist that causes pressure on your stomach.  Raise the head of your bed 6 to 8 inches with wood blocks to help you sleep. Extra pillows will not help.  Only take over-the-counter or prescription medicines for  pain, discomfort, or fever as directed by your caregiver.  Do not take aspirin, ibuprofen, or other nonsteroidal anti-inflammatory drugs (NSAIDs). SEEK IMMEDIATE MEDICAL CARE IF:   You have pain in your arms, neck, jaw, teeth, or back.  Your pain increases or changes in intensity or duration.  You develop nausea, vomiting, or sweating (diaphoresis).  You develop shortness of breath, or you faint.  Your vomit is green, yellow, black, or looks like coffee grounds or blood.  Your stool is red, bloody, or black. These symptoms could be signs of other problems, such as heart disease, gastric bleeding, or esophageal bleeding. MAKE SURE YOU:   Understand these instructions.  Will watch your condition.  Will get help right away if you are not doing well or get worse. Document Released: 05/28/2005  Document Revised: 11/10/2011 Document Reviewed: 03/07/2011 [facility] Patient Information 2015 [place], [place]. This information is not intended to replace advice given to you by your health care provider. Make sure you discuss any questions you have with your health care provider.   Colon Polyps Polyps are lumps of extra tissue growing inside the body. Polyps can grow in the large intestine (colon). Most colon polyps are noncancerous (benign). However, some colon polyps can become cancerous over time. Polyps that are larger than a pea may be harmful. To be safe, caregivers remove and test all polyps. CAUSES  Polyps form when mutations in the genes cause your cells to grow and divide even though no more tissue is needed. RISK FACTORS There are a number of risk factors that can increase your chances of getting colon polyps. They include:  Being older than 50 years.  Family history of colon polyps or colon cancer.  Long-term colon diseases, such as colitis or Crohn disease.  Being overweight.  Smoking.  Being inactive.  Drinking too much alcohol. SYMPTOMS  Most small polyps do not cause symptoms. If symptoms are present, they may include:  Blood in the stool. The stool may look dark red or black.  Constipation or diarrhea that lasts longer than 1 week. DIAGNOSIS People often do not know they have polyps until their caregiver finds them during a regular checkup. Your caregiver can use 4 tests to check for polyps:  Digital rectal exam. The caregiver wears gloves and feels inside the rectum. This test would find polyps only in the rectum.  Barium enema. The caregiver puts a liquid called barium into your rectum before taking X-rays of your colon. Barium makes your colon look white. Polyps are dark, so they are easy to see in the X-ray pictures.  Sigmoidoscopy. A thin, flexible tube (sigmoidoscope) is placed into your rectum. The sigmoidoscope has a light and tiny camera in it. The  caregiver uses the sigmoidoscope to look at the last third of your colon.  Colonoscopy. This test is like sigmoidoscopy, but the caregiver looks at the entire colon. This is the most common method for finding and removing polyps. TREATMENT  Any polyps will be removed during a sigmoidoscopy or colonoscopy. The polyps are then tested for cancer. PREVENTION  To help lower your risk of getting more colon polyps:  Eat plenty of fruits and vegetables. Avoid eating fatty foods.  Do not smoke.  Avoid drinking alcohol.  Exercise every day.  Lose weight if recommended by your caregiver.  Eat plenty of calcium and folate. Foods that are rich in calcium include milk, cheese, and broccoli. Foods that are rich in folate include chickpeas, kidney beans, and spinach. HOME CARE INSTRUCTIONS  Keep all follow-up appointments as directed by your caregiver. You may need periodic exams to check for polyps. SEEK MEDICAL CARE IF: You notice bleeding during a bowel movement. Document Released: 05/14/2004 Document Revised: 11/10/2011 Document Reviewed: 10/28/2011 [facility] Patient Information 2015 [place], [place]. This information is not intended to replace advice given to you by your health care provider. Make sure you discuss any questions you have with your health care provider.

## 2014-08-14 NOTE — Op Note (Signed)
[facility] [address] [place], [address]   COLONOSCOPY PROCEDURE REPORT  PATIENT: Harry Reynolds, Harry Reynolds  MR#: [id number] BIRTHDATE: 10/31/1960 , 53  yrs. old GENDER: male ENDOSCOPIST: R.  Garfield Cornea, MD FACP [facility] REFERRED BY:   Thurnell Garbe PROCEDURE DATE:  08/27/2014 PROCEDURE:   Colonoscopy, screening and Colonoscopy with biopsy INDICATIONS:First ever average risk colorectal cancer screening examination. MEDICATIONS: Versed 5 mg IV and Demerol 125 mg IV in divided doses. Zofran 4 mg IV. ASA CLASS:       Class II  CONSENT: The risks, benefits, alternatives and imponderables including but not limited to bleeding, perforation as well as the possibility of a missed lesion have been reviewed.  The potential for biopsy, lesion removal, etc. have also been discussed. Questions have been answered.  All parties agreeable.  Please see the history and physical in the medical record for more information.  DESCRIPTION OF PROCEDURE:   After the risks benefits and alternatives of the procedure were thoroughly explained, informed consent was obtained.  The digital rectal exam revealed no abnormalities of the rectum.   The [id number] ([id number])  endoscope was introduced through the anus and advanced to the cecum, which was identified by both the appendix and ileocecal valve. No adverse events experienced.   The quality of the prep was adequate.  The instrument was then slowly withdrawn as the colon was fully examined.. Because of equipment failure, multiple photographs taken, were not captured by the system. The cecum was unequivocally reached.      COLON FINDINGS: Normal rectum.  Normal appearing colonic mucosa aside from a single diminutive polyp in the base of the cecum. Retroflexed views revealed no abnormalities. .  Withdrawal time=13 minutes 0 seconds.  The scope was withdrawn and the procedure completed. COMPLICATIONS: There were no immediate  complications.  ENDOSCOPIC IMPRESSION: Single colonic polyp?"removed as described above  RECOMMENDATIONS: Follow up on pathology. See EGD report. Office visit with [place] in 3 months  eSigned:  R. Garfield Cornea, MD [facility] 27-Aug-2014 2:15 PM   cc:  CPT CODES: ICD CODES:  The ICD and CPT codes recommended by this software are interpretations from the data that the clinical staff has captured with the software.  The verification of the translation of this report to the ICD and CPT codes and modifiers is the sole responsibility of the health care institution and practicing physician where this report was generated.  [employer] will not be held responsible for the validity of the ICD and CPT codes included on this report.  AMA assumes no liability for data contained or not contained herein. CPT is a Designer, television/film set of the [employer].

## 2014-08-14 NOTE — H&P (Signed)
   Primary Care Physician: No PCP Per Patient Primary Gastroenterologist: Dr. Gala Romney  Pre-Procedure History & Physical: HPI: Harry Reynolds is a 53 y.o. male seen. Impaction last month. Esophagus somewhat "ringed".  He returns for elective EGD to complete examination evaluation, etc. as appropriate. Also no prior colonoscopy.  He's had long-standing intermittent issues with esophageal dysphagia. An long, long history of GERD. Started Protonix 40 mg daily last month with excellent control of reflux.  History reviewed. No pertinent past medical history.  History reviewed. No pertinent past surgical history.  Prior to Admission medications   Medication Sig Start Date End Date Taking? Authorizing Provider  cyclobenzaprine (FLEXERIL) 10 MG tablet Take 1 tablet (10 mg total) by mouth 2 (two) times daily as needed for muscle spasms. Patient not taking: Reported on 07/29/2014 05/20/13   Idalia Needle. [place], PA-C    Allergies as of 07/29/2014 - Review Complete 07/29/2014  Allergen Reaction Noted  . Codeine Nausea And Vomiting 05/20/2013  . Other Nausea And Vomiting 07/29/2014    History reviewed. No pertinent family history.  History   Social History  . Marital Status: Married    Spouse Name: N/A    Number of Children: N/A  . Years of Education: N/A   Occupational History  . Not on file.   Social History Main Topics  . Smoking status: Never Smoker   . Smokeless tobacco: Never Used  . Alcohol Use: No  . Drug Use: No  . Sexual Activity: Not on file   Other Topics Concern  . Not on file   Social History Narrative    Review of Systems: See HPI, otherwise negative ROS  Physical Exam: General: Alert, , pleasant and cooperative in NAD. Expectorating saliva into an emesis basin. Skin: Intact without significant lesions or rashes. Eyes: Sclera clear, no icterus. Conjunctiva pink. Ears: Normal  auditory acuity. Nose: No deformity, discharge, or lesions. Mouth: No deformity or lesions. Neck: Supple; no masses or thyromegaly. No significant cervical adenopathy. Lungs: Clear throughout to auscultation. No wheezes, crackles, or rhonchi. No acute distress. Heart: Regular rate and rhythm; no murmurs, clicks, rubs, or gallops. Abdomen: Non-distended, normal bowel sounds. Soft and nontender without appreciable mass or hepatosplenomegaly.  Pulses: Normal pulses noted. Extremities: Without clubbing or edema.  Impression/Plan: 53 year old gentleman with with recent esophageal food impaction. Has chronic esophageal dysphagia. Has frequent GERD - long-standing with no prior workup. He likely has a food impaction. Elective EGD with esophageal dilation as appropriate followed by screening colonoscopy. The risks, benefits, limitations, alternatives and imponderables have been reviewed with the patient. Potential for esophageal dilation, biopsy, etc. have also been reviewed. Questions have been answered. All parties agreeable.

## 2014-08-20 ENCOUNTER — Encounter: Payer: Self-pay | Admitting: Internal Medicine

## 2014-08-21 ENCOUNTER — Telehealth: Payer: Self-pay

## 2014-08-21 ENCOUNTER — Encounter: Payer: Self-pay | Admitting: Internal Medicine

## 2014-08-21 NOTE — Telephone Encounter (Signed)
Information and letter mailed to the pt.

## 2014-08-21 NOTE — Telephone Encounter (Signed)
TCS ON RECALL AND APPT MADE , LETTER SENT

## 2014-08-21 NOTE — Telephone Encounter (Signed)
Per RMR- plse send info on EOE. Send letter to patient.  Send copy of letter with path to referring provider and PCP.  Needs ov w extender in 42mos if not already scheduled.

## 2014-08-31 NOTE — Telephone Encounter (Signed)
appt made

## 2014-09-11 ENCOUNTER — Encounter (HOSPITAL_COMMUNITY): Payer: Self-pay | Admitting: Internal Medicine

## 2014-09-14 ENCOUNTER — Encounter (HOSPITAL_COMMUNITY): Payer: Self-pay | Admitting: Internal Medicine

## 2014-09-21 ENCOUNTER — Other Ambulatory Visit: Payer: Self-pay

## 2014-09-21 MED ORDER — PANTOPRAZOLE SODIUM 40 MG PO TBEC: 40.0000 mg | DELAYED_RELEASE_TABLET | Freq: Every day | ORAL | Status: DC

## 2014-11-20 ENCOUNTER — Ambulatory Visit: Payer: BC Managed Care – PPO | Admitting: Gastroenterology

## 2014-12-08 ENCOUNTER — Ambulatory Visit (INDEPENDENT_AMBULATORY_CARE_PROVIDER_SITE_OTHER): Payer: BLUE CROSS/BLUE SHIELD | Admitting: Gastroenterology

## 2014-12-08 ENCOUNTER — Encounter: Payer: Self-pay | Admitting: Gastroenterology

## 2014-12-08 VITALS — BP 145/95 | HR 78 | Temp 98.3°F | Ht 68.0 in | Wt 191.0 lb

## 2014-12-08 DIAGNOSIS — K200 Eosinophilic esophagitis: Secondary | ICD-10-CM | POA: Insufficient documentation

## 2014-12-08 DIAGNOSIS — K219 Gastro-esophageal reflux disease without esophagitis: Secondary | ICD-10-CM

## 2014-12-08 NOTE — Patient Instructions (Signed)
1. Continue pantoprazole 40mg  daily. 2. Return to the office for follow up in 6 months.  3. Call if you start having problems swallowing again.

## 2014-12-08 NOTE — Progress Notes (Signed)
      Primary Care Physician: No PCP Per Patient  Primary Gastroenterologist:  Garfield Cornea, MD   Chief Complaint  Patient presents with  . Follow-up    HPI: Harry Reynolds is a 54 y.o. male here for follow-up. December 2015 he underwent EGD with colonoscopy. Actually been seen prior to that for food impaction requiring EGD emergently for removal of food (very limited exam). On repeat EGD he had abnormal esophagus concerning for eosinophilic esophagitis. Had esophageal dilation. Small hiatal hernia seen. Multiple biopsies with increased eosinophils consistent with eosinophilic esophagitis. He has a single colon polyp removed which was benign. No adenomatous changes. Next colonoscopy to be done in 10 years.  Patient returns today stating he is doing very well. He's had no issues with swallowing. Complete 100% improvement status post dilation. Currently on Protonix 40 mg daily. Was not treated with fluticasone or proceed any allergy testing at this point. Denies any heartburn, abdominal pain, odynophagia, constipation, diarrhea, melena, rectal bleeding.   Past Surgical History  Procedure Laterality Date  . Esophagogastroduodenoscopy N/A 07/29/2014    RMR: Limitied EGD;food impaction; status post disimpaction as described above.   . Colonoscopy N/A 08/14/2014    [id number] colonic polyp removed as described above.   . Esophagogastroduodenoscopy N/A 08/14/2014    RMR: Abnormal esophagus concerning for EOE. Status post passage of a Maloney dilator and subsequent biopsy. Small hiatal hernia;otherwise normal EGD.   Marland Kitchen Esophageal dilation  08/14/2014    Procedure: ESOPHAGEAL DILATION;  Surgeon: Daneil Dolin, MD;  Location: [facility];  Service: Endoscopy;;     Current Outpatient Prescriptions  Medication Sig Dispense Refill  . ibuprofen (ADVIL,MOTRIN) 200 MG tablet Take 400 mg by mouth every 6 (six) hours as needed for headache.    . Multiple Vitamin (MULTIVITAMIN WITH MINERALS) TABS  tablet Take 1 tablet by mouth daily.    . pantoprazole (PROTONIX) 40 MG tablet Take 1 tablet (40 mg total) by mouth daily. 30 tablet 5   No current facility-administered medications for this visit.    Allergies as of 12/08/2014 - Review Complete 12/08/2014  Allergen Reaction Noted  . Codeine Nausea And Vomiting 05/20/2013  . Other Nausea And Vomiting 07/29/2014    ROS:  General: Negative for anorexia, weight loss, fever, chills, fatigue, weakness. ENT: Negative for hoarseness, difficulty swallowing , nasal congestion. CV: Negative for chest pain, angina, palpitations, dyspnea on exertion, peripheral edema.  Respiratory: Negative for dyspnea at rest, dyspnea on exertion, cough, sputum, wheezing.  GI: See history of present illness. GU:  Negative for dysuria, hematuria, urinary incontinence, urinary frequency, nocturnal urination.  Endo: Negative for unusual weight change.    Physical Examination:   BP 145/95 mmHg  Pulse 78  Temp(Src) 98.3 F (36.8 C) (Oral)  Ht 5\' 8"  (1.727 m)  Wt 191 lb (86.637 kg)  BMI 29.05 kg/m2  General: Well-nourished, well-developed in no acute distress.  Eyes: No icterus. Mouth: Oropharyngeal mucosa moist and pink , no lesions erythema or exudate. Lungs: Clear to auscultation bilaterally.  Heart: Regular rate and rhythm, no murmurs rubs or gallops.  Abdomen: Bowel sounds are normal, nontender, nondistended, no hepatosplenomegaly or masses, no abdominal bruits or hernia , no rebound or guarding.   Extremities: No lower extremity edema. No clubbing or deformities. Neuro: Alert and oriented x 4   Skin: Warm and dry, no jaundice.   Psych: Alert and cooperative, normal mood and affect.

## 2014-12-11 NOTE — Progress Notes (Signed)
No pcp per patient 

## 2014-12-11 NOTE — Assessment & Plan Note (Signed)
EGD findings suggestive of eosinophilic esophagitis endoscopically as well as on pathology. Clinically patient doing well status post esophageal dilation and PPI therapy. Denies any typical heartburn. Dysphagia 100% improved. To discuss further management per Dr. Gala Romney. Patient is aware let us know if he has recurrent dysphagia. Plan to see him back in 6 months as well.

## 2014-12-15 NOTE — Progress Notes (Signed)
Discussed with Dr. Gala Romney. For now since patient is doing 100% better on PPI therapy, we will continue PPI therapy only. See him back in 6 months has planned. If he has recurrent dysphagia requiring repeat endoscopy with dilation and has evidence of eosinophilic esophagitis at that time then he would receive fluticasone and referral to allergist

## 2015-04-23 ENCOUNTER — Other Ambulatory Visit: Payer: Self-pay | Admitting: Nurse Practitioner

## 2015-06-08 ENCOUNTER — Ambulatory Visit: Payer: BLUE CROSS/BLUE SHIELD | Admitting: Gastroenterology

## 2020-03-30 ENCOUNTER — Inpatient Hospital Stay
Admission: EM | Admit: 2020-03-30 | Discharge: 2020-04-01 | DRG: 871 | Disposition: A | Discharge status: Home / Self Care | Payer: BLUE CROSS/BLUE SHIELD | Source: Ambulatory Visit | Attending: Internal Medicine | Admitting: Internal Medicine

## 2020-03-30 ENCOUNTER — Encounter: Payer: Self-pay | Admitting: Emergency Medicine

## 2020-03-30 ENCOUNTER — Emergency Department: Payer: BLUE CROSS/BLUE SHIELD

## 2020-03-30 ENCOUNTER — Other Ambulatory Visit: Payer: Self-pay

## 2020-03-30 DIAGNOSIS — Q254 Congenital malformation of aorta unspecified: Secondary | ICD-10-CM

## 2020-03-30 DIAGNOSIS — Z6830 Body mass index (BMI) 30.0-30.9, adult: Secondary | ICD-10-CM

## 2020-03-30 DIAGNOSIS — Z20822 Contact with and (suspected) exposure to covid-19: Secondary | ICD-10-CM | POA: Diagnosis present

## 2020-03-30 DIAGNOSIS — J189 Pneumonia, unspecified organism: Secondary | ICD-10-CM | POA: Diagnosis present

## 2020-03-30 DIAGNOSIS — G4733 Obstructive sleep apnea (adult) (pediatric): Secondary | ICD-10-CM | POA: Diagnosis present

## 2020-03-30 DIAGNOSIS — I712 Thoracic aortic aneurysm, without rupture, unspecified: Secondary | ICD-10-CM | POA: Diagnosis present

## 2020-03-30 DIAGNOSIS — Z884 Allergy status to anesthetic agent status: Secondary | ICD-10-CM

## 2020-03-30 DIAGNOSIS — K219 Gastro-esophageal reflux disease without esophagitis: Secondary | ICD-10-CM | POA: Diagnosis present

## 2020-03-30 DIAGNOSIS — Z885 Allergy status to narcotic agent status: Secondary | ICD-10-CM

## 2020-03-30 DIAGNOSIS — A419 Sepsis, unspecified organism: Principal | ICD-10-CM | POA: Diagnosis present

## 2020-03-30 DIAGNOSIS — Z713 Dietary counseling and surveillance: Secondary | ICD-10-CM

## 2020-03-30 DIAGNOSIS — Z79899 Other long term (current) drug therapy: Secondary | ICD-10-CM

## 2020-03-30 LAB — PROTIME-INR
INR: 1.1 (ref 0.8–1.2)
Prothrombin Time: 14.1 seconds (ref 11.4–15.2)

## 2020-03-30 LAB — CBC WITH DIFFERENTIAL/PLATELET
Abs Immature Granulocytes: 0.09 10*3/uL — ABNORMAL HIGH (ref 0.00–0.07)
Basophils Absolute: 0.1 10*3/uL (ref 0.0–0.1)
Basophils Relative: 1 %
Eosinophils Absolute: 0.3 10*3/uL (ref 0.0–0.5)
Eosinophils Relative: 2 %
HCT: 44.3 % (ref 39.0–52.0)
Hemoglobin: 15.3 g/dL (ref 13.0–17.0)
Immature Granulocytes: 1 %
Lymphocytes Relative: 13 %
Lymphs Abs: 1.9 10*3/uL (ref 0.7–4.0)
MCH: 31.3 pg (ref 26.0–34.0)
MCHC: 34.5 g/dL (ref 30.0–36.0)
MCV: 90.6 fL (ref 80.0–100.0)
Monocytes Absolute: 1.2 10*3/uL — ABNORMAL HIGH (ref 0.1–1.0)
Monocytes Relative: 9 %
Neutro Abs: 11.1 10*3/uL — ABNORMAL HIGH (ref 1.7–7.7)
Neutrophils Relative %: 74 %
Platelets: 245 10*3/uL (ref 150–400)
RBC: 4.89 MIL/uL (ref 4.22–5.81)
RDW: 13 % (ref 11.5–15.5)
WBC: 14.7 10*3/uL — ABNORMAL HIGH (ref 4.0–10.5)
nRBC: 0 % (ref 0.0–0.2)

## 2020-03-30 LAB — COMPREHENSIVE METABOLIC PANEL
ALT: 35 U/L (ref 0–44)
AST: 25 U/L (ref 15–41)
Albumin: 4.5 g/dL (ref 3.5–5.0)
Alkaline Phosphatase: 77 U/L (ref 38–126)
Anion gap: 11 (ref 5–15)
BUN: 16 mg/dL (ref 6–20)
CO2: 27 mmol/L (ref 22–32)
Calcium: 9 mg/dL (ref 8.9–10.3)
Chloride: 102 mmol/L (ref 98–111)
Creatinine, Ser: 1.06 mg/dL (ref 0.61–1.24)
GFR calc Af Amer: >60 mL/min (ref >60)
GFR calc non Af Amer: >60 mL/min (ref >60)
Glucose, Bld: 114 mg/dL — ABNORMAL HIGH (ref 70–99)
Potassium: 3.7 mmol/L (ref 3.5–5.1)
Sodium: 140 mmol/L (ref 135–145)
Total Bilirubin: 1 mg/dL (ref 0.3–1.2)
Total Protein: 8.3 g/dL — ABNORMAL HIGH (ref 6.5–8.1)

## 2020-03-30 LAB — RESP PANEL BY RT PCR (RSV, FLU A&B, COVID)
Influenza A by PCR: NEGATIVE
Influenza B by PCR: NEGATIVE
Respiratory Syncytial Virus by PCR: NEGATIVE
SARS Coronavirus 2 by RT PCR: NEGATIVE

## 2020-03-30 LAB — APTT: aPTT: 30 seconds (ref 24–36)

## 2020-03-30 LAB — MAGNESIUM: Magnesium: 1.7 mg/dL (ref 1.7–2.4)

## 2020-03-30 LAB — TROPONIN I (HIGH SENSITIVITY): Troponin I (High Sensitivity): 6 ng/L (ref <18)

## 2020-03-30 LAB — LACTIC ACID, PLASMA: Lactic Acid, Venous: 1.5 mmol/L (ref 0.5–1.9)

## 2020-03-30 MED ORDER — ENOXAPARIN SODIUM 40 MG/0.4ML ~~LOC~~ SOLN
40.0000 mg | SUBCUTANEOUS | Status: DC
  Administered 2020-03-30 – 2020-03-31 (×2): 40 mg via SUBCUTANEOUS
  Filled 2020-03-30 (×2): qty 0.4

## 2020-03-30 MED ORDER — LACTATED RINGERS IV BOLUS
500.0000 mL | Freq: Once | INTRAVENOUS | Status: AC
  Administered 2020-03-30: 500 mL via INTRAVENOUS

## 2020-03-30 MED ORDER — SODIUM CHLORIDE 0.9 % IV SOLN
1.0000 g | Freq: Once | INTRAVENOUS | Status: AC
  Administered 2020-03-30: 1 g via INTRAVENOUS
  Filled 2020-03-30: qty 10

## 2020-03-30 MED ORDER — SODIUM CHLORIDE 0.9 % IV SOLN
2.0000 g | INTRAVENOUS | Status: DC
  Administered 2020-03-31: 2 g via INTRAVENOUS
  Filled 2020-03-30: qty 20
  Filled 2020-03-30: qty 2

## 2020-03-30 MED ORDER — SODIUM CHLORIDE 0.9 % IV SOLN: INTRAVENOUS | Status: DC

## 2020-03-30 MED ORDER — GUAIFENESIN-DM 100-10 MG/5ML PO SYRP
5.0000 mL | ORAL_SOLUTION | ORAL | Status: DC | PRN
  Administered 2020-03-31 (×2): 5 mL via ORAL
  Filled 2020-03-30 (×2): qty 5

## 2020-03-30 MED ORDER — ACETAMINOPHEN 500 MG PO TABS
1000.0000 mg | ORAL_TABLET | Freq: Once | ORAL | Status: AC
  Administered 2020-03-30: 1000 mg via ORAL
  Filled 2020-03-30: qty 2

## 2020-03-30 MED ORDER — IOHEXOL 350 MG/ML SOLN
75.0000 mL | Freq: Once | INTRAVENOUS | Status: AC | PRN
  Administered 2020-03-30: 100 mL via INTRAVENOUS

## 2020-03-30 MED ORDER — SODIUM CHLORIDE 0.9 % IV SOLN
500.0000 mg | INTRAVENOUS | Status: DC
  Filled 2020-03-30: qty 500

## 2020-03-30 MED ORDER — LACTATED RINGERS IV BOLUS
1000.0000 mL | Freq: Once | INTRAVENOUS | Status: AC
  Administered 2020-03-30: 1000 mL via INTRAVENOUS

## 2020-03-30 MED ORDER — SODIUM CHLORIDE 0.9% FLUSH: 3.0000 mL | Freq: Once | INTRAVENOUS | Status: DC

## 2020-03-30 MED ORDER — SODIUM CHLORIDE 0.9 % IV SOLN
500.0000 mg | Freq: Once | INTRAVENOUS | Status: AC
  Administered 2020-03-30: 500 mg via INTRAVENOUS
  Filled 2020-03-30: qty 500

## 2020-03-30 NOTE — H&P (Signed)
History and Physical   Harry Reynolds [id number]:[id number] DOB: May 21, 1961 DOA: 03/30/2020  Referring MD/NP/PA: Dr. Hulan Saas  PCP: Patient, No Pcp Per   Outpatient Specialists: None  Patient coming from: Home  Chief Complaint: Cough with abnormal x-ray  HPI: Harry Reynolds is a 59 y.o. male with medical history significant of GERD, postoperative nausea vomiting, morbid obesity who has not been following up with physicians on a regular basis.  Patient has had productive cough some shortness of breath and chest tightness recently.  Also has sore throat and congestion.  He was seen in the outpatient basis where a Covid test was performed.  Results was pending at the time patient came to the ER.  As part of the work-up he also had a chest x-ray which showed no significant findings except for an large aortic silhouette.  His mother died from aneurysm so concern was for possible aneurysm and patient was sent to the ER for further evaluation.  In the ER CT angiogram of the chest showed no PE but did confirm aortic aneurysm in the ascending aorta but also early pneumonia.  His COVID-19 screen in the ER is negative.  Patient therefore being admitted with community-acquired pneumonia.    ED Course: Temperature one 2.7 blood pressure 106/108 pulse 131 respirate of 20 oxygen sat 95% room air.  White count is 14.7 the rest of the CBC and chemistries within normal.  Magnesium 1.7.  Lactic acid 1.5.  Chest x-ray showed no consolidation or effusion.  There was question of dilatation of thoracic aorta.  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  No static aortic aneurysm which measures about 5.1 cm.  No dissection seen.  There was patchy groundglass and tree-in-bud opacities in the dependent right lower lobe likely infectious.  Patient therefore being admitted for further treatment.  Not a smoker.  Review of Systems: As per HPI otherwise 10 point review of systems negative.     Past Medical History:  Diagnosis Date   Complication of anesthesia    patient reports nausea and vomiting after general anesthesia by history, no rashes    GERD (gastroesophageal reflux disease)    PONV (postoperative nausea and vomiting)     Past Surgical History:  Procedure Laterality Date   COLONOSCOPY N/A 08/14/2014   [id number] colonic polyp removed as described above.    ESOPHAGEAL DILATION  08/14/2014   Procedure: ESOPHAGEAL DILATION;  Surgeon: Daneil Dolin, MD;  Location: [facility];  Service: Endoscopy;;   ESOPHAGOGASTRODUODENOSCOPY N/A 07/29/2014   RMR: Limitied EGD;food impaction; status post disimpaction as described above.    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 Status post passage of a Maloney dilator and subsequent biopsy. Small hiatal hernia;otherwise normal EGD.      reports that he has never smoked. He has never used smokeless tobacco. He reports current alcohol use of about 1.0 standard drink of alcohol per week. He reports that he does not use drugs.  Allergies  Allergen Reactions   Codeine Nausea And Vomiting   Other Nausea And Vomiting    Any kind of anesthesia makes patient sick    Family History  Problem Relation Age of Onset   Diabetes Mother    Heart disease Paternal Grandfather    Colon cancer Neg Hx      Prior to Admission medications   Medication Sig Start Date End Date Taking? Authorizing Provider  ibuprofen (ADVIL,MOTRIN) 200 MG tablet Take  400 mg by mouth every 6 (six) hours as needed for headache.    [provider]  Multiple Vitamin (MULTIVITAMIN WITH MINERALS) TABS tablet Take 1 tablet by mouth daily.    [provider]  pantoprazole (PROTONIX) 40 MG tablet TAKE 1 TABLET BY MOUTH EVERY DAY 04/23/15   Annitta Needs, NP    Physical Exam: Vitals:   03/30/20 1837 03/30/20 1858 03/30/20 1930 03/30/20 2021  BP:  118/85 (!) 108/63 (!) 134/91  Pulse:  (!) 113 (!) 112 (!) 107 98  Resp: '20 18 16 18  ' Temp:    99.1 F (37.3 C)  TempSrc:    Oral  SpO2: 96% 95% 95% 95%  Weight:      Height:          Constitutional: Obese stable no acute distress Vitals:   03/30/20 1837 03/30/20 1858 03/30/20 1930 03/30/20 2021  BP:  118/85 (!) 108/63 (!) 134/91  Pulse: (!) 113 (!) 112 (!) 107 98  Resp: '20 18 16 18  ' Temp:    99.1 F (37.3 C)  TempSrc:    Oral  SpO2: 96% 95% 95% 95%  Weight:      Height:       Eyes: PERRL, lids and conjunctivae normal ENMT: Mucous membranes are moist. Posterior pharynx clear of any exudate or lesions.Normal dentition.  Neck: normal, supple, no masses, no thyromegaly Respiratory: Good air entry bilaterally, bilateral coarse breath sounds and rhonchi more in the lower lobes. Normal respiratory effort. No accessory muscle use.  Cardiovascular: Sinus tachycardia, no murmurs / rubs / gallops. No extremity edema. 2+ pedal pulses. No carotid bruits.  Abdomen: no tenderness, no masses palpated. No hepatosplenomegaly. Bowel sounds positive.  Musculoskeletal: no clubbing / cyanosis. No joint deformity upper and lower extremities. Good ROM, no contractures. Normal muscle tone.  Skin: no rashes, lesions, ulcers. No induration Neurologic: CN 2-12 grossly intact. Sensation intact, DTR normal. Strength 5/5 in all 4.  Psychiatric: Normal judgment and insight. Alert and oriented x 3. Normal mood.     Labs on Admission: I have personally reviewed following labs and imaging studies  CBC: Recent Labs  Lab 03/30/20 1432  WBC 14.7*  NEUTROABS 11.1*  HGB 15.3  HCT 44.3  MCV 90.6  PLT 098   Basic Metabolic Panel: Recent Labs  Lab 03/30/20 1432 03/30/20 1723  NA 140  --   K 3.7  --   CL 102  --   CO2 27  --   GLUCOSE 114*  --   BUN 16  --   CREATININE 1.06  --   CALCIUM 9.0  --   MG  --  1.7   GFR: Estimated Creatinine Clearance: 84.7 mL/min (by C-G formula based on SCr of 1.06 mg/dL). Liver Function  Tests: Recent Labs  Lab 03/30/20 1432  AST 25  ALT 35  ALKPHOS 77  BILITOT 1.0  PROT 8.3*  ALBUMIN 4.5   No results for input(s): LIPASE, AMYLASE in the last 168 hours. No results for input(s): AMMONIA in the last 168 hours. Coagulation Profile: Recent Labs  Lab 03/30/20 2029  INR 1.1   Cardiac Enzymes: No results for input(s): CKTOTAL, CKMB, CKMBINDEX, TROPONINI in the last 168 hours. BNP (last 3 results) No results for input(s): PROBNP in the last 8760 hours. HbA1C: No results for input(s): HGBA1C in the last 72 hours. CBG: No results for input(s): GLUCAP in the last 168 hours. Lipid Profile: No results for input(s): CHOL, HDL, LDLCALC, TRIG, CHOLHDL,  LDLDIRECT in the last 72 hours. Thyroid Function Tests: No results for input(s): TSH, T4TOTAL, FREET4, T3FREE, THYROIDAB in the last 72 hours. Anemia Panel: No results for input(s): VITAMINB12, FOLATE, FERRITIN, TIBC, IRON, RETICCTPCT in the last 72 hours. Urine analysis: No results found for: COLORURINE, APPEARANCEUR, LABSPEC, PHURINE, GLUCOSEU, HGBUR, BILIRUBINUR, KETONESUR, PROTEINUR, UROBILINOGEN, NITRITE, LEUKOCYTESUR Sepsis Labs: '@LABRCNTIP' (procalcitonin:4,lacticidven:4) ) Recent Results (from the past 240 hour(s))  Resp Panel by RT PCR (RSV, Flu A&B, Covid) - Nasopharyngeal Swab     Status: None   Collection Time: 03/30/20  5:22 PM   Specimen: Nasopharyngeal Swab  Result Value Ref Range Status   SARS Coronavirus 2 by RT PCR NEGATIVE NEGATIVE Final    Comment: (NOTE) SARS-CoV-2 target nucleic acids are NOT DETECTED.  The SARS-CoV-2 RNA is generally detectable in upper respiratoy specimens during the acute phase of infection. The lowest concentration of SARS-CoV-2 viral copies this assay can detect is 131 copies/mL. A negative result does not preclude SARS-Cov-2 infection and should not be used as the sole basis for treatment or other patient management decisions. A negative result may occur with  improper  specimen collection/handling, submission of specimen other than nasopharyngeal swab, presence of viral mutation(s) within the areas targeted by this assay, and inadequate number of viral copies (<131 copies/mL). A negative result must be combined with clinical observations, patient history, and epidemiological information. The expected result is Negative.  Fact Sheet for Patients:  PinkCheek.be  Fact Sheet for Healthcare Providers:  GravelBags.it  This test is no t yet approved or cleared by the [place] FDA and  has been authorized for detection and/or diagnosis of SARS-CoV-2 by FDA under an Emergency Use Authorization (EUA). This EUA will remain  in effect (meaning this test can be used) for the duration of the COVID-19 declaration under Section 564(b)(1) of the Act, 21 U.S.C. section 360bbb-3(b)(1), unless the authorization is terminated or revoked sooner.     Influenza A by PCR NEGATIVE NEGATIVE Final   Influenza B by PCR NEGATIVE NEGATIVE Final    Comment: (NOTE) The Xpert Xpress SARS-CoV-2/FLU/RSV assay is intended as an aid in  the diagnosis of influenza from Nasopharyngeal swab specimens and  should not be used as a sole basis for treatment. Nasal washings and  aspirates are unacceptable for Xpert Xpress SARS-CoV-2/FLU/RSV  testing.  Fact Sheet for Patients: PinkCheek.be  Fact Sheet for Healthcare Providers: GravelBags.it  This test is not yet approved or cleared by the [place] FDA and  has been authorized for detection and/or diagnosis of SARS-CoV-2 by  FDA under an Emergency Use Authorization (EUA). This EUA will remain  in effect (meaning this test can be used) for the duration of the  Covid-19 declaration under Section 564(b)(1) of the Act, 21  U.S.C. section 360bbb-3(b)(1), unless the authorization is  terminated or revoked.     Respiratory Syncytial Virus by PCR NEGATIVE NEGATIVE Final    Comment: (NOTE) Fact Sheet for Patients: PinkCheek.be  Fact Sheet for Healthcare Providers: GravelBags.it  This test is not yet approved or cleared by the [place] FDA and  has been authorized for detection and/or diagnosis of SARS-CoV-2 by  FDA under an Emergency Use Authorization (EUA). This EUA will remain  in effect (meaning this test can be used) for the duration of the  COVID-19 declaration under Section 564(b)(1) of the Act, 21 U.S.C.  section 360bbb-3(b)(1), unless the authorization is terminated or  revoked. Performed at [facility], [address], [place], [place] [address]  Radiological Exams on Admission: DG Chest 2 View  Result Date: 03/30/2020 CLINICAL DATA:  Chest pain, abnormal x-ray at outside clinic. EXAM: CHEST - 2 VIEW COMPARISON:  CT chest from 2008. FINDINGS: Trachea is midline. Cardiomediastinal contours with similar appearance with RIGHT-sided aortic arch and tortuosity of the aorta. Since previous imaging studies there is suggestion of further dilation of the aorta, difficult to quantify based on potential differences in projection when compared to prior studies. Appearing largely similar on the lateral view when compared to 2014 no sign of pleural effusion. Lungs are clear. Visualized skeletal structures are unremarkable. IMPRESSION: 1. No consolidation or effusion. 2. Question of further dilation of the thoracic aorta in this patient with RIGHT-sided arch with some dilation of the proximal arch with diverticulum in the setting of RIGHT arch and aberrant LEFT subclavian artery. CT of the chest may be helpful for further assessment. Electronically Signed   By: Zetta Bills M.D.   On: 03/30/2020 15:28   CT ANGIO CHEST AORTA W/CM & OR WO/CM  Result Date: 03/30/2020 CLINICAL DATA:  Nonspecific chest pain. EXAM: CT ANGIOGRAPHY  CHEST WITH CONTRAST TECHNIQUE: Multidetector CT imaging of the chest was performed using the standard protocol during bolus administration of intravenous contrast. Multiplanar CT image reconstructions and MIPs were obtained to evaluate the vascular anatomy. CONTRAST:  150m OMNIPAQUE IOHEXOL 350 MG/ML SOLN COMPARISON:  Radiograph earlier today. Chest CT 04/12/2007, no interval chest CT. FINDINGS: Cardiovascular: Right-sided aortic arch as seen on prior exam. Aberrant left subclavian artery. Aortic dilatation/diverticulum at the takeoff of the right subclavian, 5.1 cm when measured on series 9, image 58. There is tortuosity of the transverse aorta with dilatation of the proximal descending measuring 4.4 cm, measured on sagittal series 10, image 50. On axial images this measures 4.5 cm and has progressed from prior exam where it measured 4 cm. Direct comparison is limited given lack of sagittal reformats on the prior exam. There is mild fusiform dilatation of the descending aorta at 4 cm. No dissection. No significant atherosclerosis. The aorta courses to the normal position in the upper abdomen to the left of the IVC. Pulmonary arteries are patent to the lobar level. The heart is normal in size. There is no pericardial effusion. Mild cardiac motion artifact partially obscures detailed cardiac anatomy. No pericardial effusion. Mediastinum/Nodes: No enlarged mediastinal or hilar lymph nodes. There is no thyroid nodule. Mild mass effect on the posterior esophagus from aortic dilatation. No esophageal wall thickening. Lungs/Pleura: Mild compressive atelectasis in the medial right lung adjacent to the descending aorta. Patchy ground-glass and tree in bud opacities in the dependent right lower lobe. Mild central bronchial thickening, areas of mucous plugging in the right lower lobe. No pulmonary mass or suspicious nodule. No findings of pulmonary edema. No pleural fluid. Upper Abdomen: Suggestion of hepatic steatosis, not  well assessed on arterial phase exam no acute findings. Musculoskeletal: There are no acute or suspicious osseous abnormalities. Review of the MIP images confirms the above findings. 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 2. Thoracic aortic aneurysm. Aortic dilatation/diverticulum at the takeoff of the right subclavian artery measuring 5.1 cm. Maximal dimension of the proximal descending aorta measuring 4.5 cm, increased from prior exam where it measured 4 cm. No dissection or acute aortic findings. There is mild fusiform dilatation of the descending aorta at 4 cm. Recommend vascular surgery referral. Recommendations for arch aneurysms of this size are as follows: Recommend semi-annual imaging followup by CTA or MRA and referral  to cardiothoracic surgery if not already obtained. This recommendation follows 2010 ACCF/AHA/AATS/ACR/ASA/SCA/SCAI/SIR/STS/SVM Guidelines for the Diagnosis and Management of Patients With Thoracic Aortic Disease. Circulation. 2010; 121: H607-P71. Aortic aneurysm NOS (ICD10-I71.9) 3. Dilated aortic causes mass effect on the esophagus. 4. Patchy ground-glass and tree in bud opacities in the dependent right lower lobe, likely infectious or inflammatory. 5. Mild central bronchial thickening, areas of mucous plugging in the right lower lobe. Electronically Signed   By: Keith Rake M.D.   On: 03/30/2020 16:45    EKG: Independently reviewed.  Sinus tachycardia.  Assessment/Plan Principal Problem:   CAP (community acquired pneumonia) Active Problems:   GERD (gastroesophageal reflux disease)   Sepsis ([place])     #1 sepsis due to pneumonia: Patient has met SIRS criteria plus evidence of pneumonia.  Will treat with IV fluids sepsis protocol.  Empiric Rocephin and Zithromax.  Obtain blood cultures.  Monitor his response in the morning.    #2 community-acquired pneumonia: Continue treatment as per above.  #3 GERD: Continue with PPIs  #4 obesity:  Counseling provided   #5 thoracic aortic aneurysm: Currently at 5.1.  Patient will need outpatient follow-up with vascular surgery   DVT prophylaxis: Lovenox Code Status: Full code Family Communication: Wife at bedside Disposition Plan: Home Consults called: None Admission status: Inpatient  Severity of Illness: The appropriate patient status for this patient is INPATIENT. Inpatient status is judged to be reasonable and necessary in order to provide the required intensity of service to ensure the patient's safety. The patient's presenting symptoms, physical exam findings, and initial radiographic and laboratory data in the context of their chronic comorbidities is felt to place them at high risk for further clinical deterioration. Furthermore, it is not anticipated that the patient will be medically stable for discharge from the hospital within 2 midnights of admission. The following factors support the patient status of inpatient.   " The patient's presenting symptoms include shortness of breath and cough. " The worrisome physical exam findings include coarse breath sounds. " The initial radiographic and laboratory data are worrisome because of findings of pneumonia but also aortic aneurysm. " The chronic co-morbidities include GERD.   * I certify that at the point of admission it is my clinical judgment that the patient will require inpatient hospital care spanning beyond 2 midnights from the point of admission due to high intensity of service, high risk for further deterioration and high frequency of surveillance required.Barbette Merino MD [facility] Pager [phone]  If 7PM-7AM, please contact night-coverage [email or web address] Password [email or web address]  03/30/2020, 11:56 PM

## 2020-03-30 NOTE — ED Provider Notes (Addendum)
[facility] Emergency Department Provider Note  ____________________________________________   First MD Initiated Contact with Patient 03/30/20 1700     (approximate)  I have reviewed the triage vital signs and the nursing notes.   HISTORY  Chief Complaint abnormal x-ray   HPI Harry Reynolds is a 59 y.o. male with a past medical history of GERD who presents for assessment after routine outpatient chest x-ray showed concerns for abnormal aortic silhouette.  Patient obtained the x-ray as he states over the last 2 weeks he has had productive cough associated with some shortness of breath, chest tightness, sore throat, and congestion.  He states he is not had any recent fevers, vomiting, diarrhea, dysuria, rash, earache, eye pain, back pain, abdominal pain, or other acute complaints.  No prior similar episodes.  No clear alleviating or aggravating factors.  Patient states he has been attempting to hydrate at 1 point received a course of steroids but this did not significantly help.  No clear sick contacts.  No recent travel outside [place].  Denies tobacco abuse, EtOH abuse, illicit drug use.         Past Medical History:  Diagnosis Date  . Complication of anesthesia    patient reports nausea and vomiting after general anesthesia by history, no rashes   . GERD (gastroesophageal reflux disease)   . PONV (postoperative nausea and vomiting)     Patient Active Problem List   Diagnosis Date Noted  . CAP (community acquired pneumonia) 03/30/2020  . Eosinophilic esophagitis 11/94/1740  . GERD (gastroesophageal reflux disease) 12/08/2014  . History of colonic polyps   . Abnormality of esophagus   . Dysphagia, pharyngoesophageal phase   . Food impaction of esophagus     Past Surgical History:  Procedure Laterality Date  . COLONOSCOPY N/A 08/14/2014   CXK:GYJEHU colonic polyp removed as described above.   . ESOPHAGEAL DILATION  08/14/2014   Procedure:  ESOPHAGEAL DILATION;  Surgeon: Daneil Dolin, MD;  Location: [facility];  Service: Endoscopy;;  . ESOPHAGOGASTRODUODENOSCOPY N/A 07/29/2014   RMR: Limitied EGD;food impaction; status post disimpaction as described above.   . 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 Status post passage of a Maloney dilator and subsequent biopsy. Small hiatal hernia;otherwise normal EGD.     Prior to Admission medications   Medication Sig Start Date End Date Taking? Authorizing Provider  ibuprofen (ADVIL,MOTRIN) 200 MG tablet Take 400 mg by mouth every 6 (six) hours as needed for headache.    [provider]  Multiple Vitamin (MULTIVITAMIN WITH MINERALS) TABS tablet Take 1 tablet by mouth daily.    [provider]  pantoprazole (PROTONIX) 40 MG tablet TAKE 1 TABLET BY MOUTH EVERY DAY 04/23/15   Annitta Needs, NP    Allergies Codeine and Other  Family History  Problem Relation Age of Onset  . Diabetes Mother   . Heart disease Paternal Grandfather   . Colon cancer Neg Hx     Social History Social History   Tobacco Use  . Smoking status: Never Smoker  . Smokeless tobacco: Never Used  Substance Use Topics  . Alcohol use: Yes    Alcohol/week: 1.0 standard drink    Types: 1 Cans of beer per week    Comment: maybe one beer every few weeks   . Drug use: No    Review of Systems  Review of Systems  Constitutional: Positive for malaise/fatigue. Negative for chills and fever.  HENT:  Positive for congestion and sore throat.   Eyes: Negative for pain.  Respiratory: Positive for cough. Negative for stridor.   Cardiovascular: Positive for chest pain.  Gastrointestinal: Negative for vomiting.  Skin: Negative for rash.  Neurological: Negative for seizures, loss of consciousness and headaches.  Psychiatric/Behavioral: Negative for suicidal ideas.  All other systems reviewed and are negative.      ____________________________________________   PHYSICAL EXAM:  VITAL SIGNS: ED Triage Vitals  Enc Vitals Group     BP 03/30/20 1422 (!) 161/108     Pulse Rate 03/30/20 1422 (!) 117     Resp 03/30/20 1422 20     Temp 03/30/20 1422 (!) 102.1 F (38.9 C)     Temp Source 03/30/20 1422 Oral     SpO2 03/30/20 1422 97 %     Weight 03/30/20 1423 (!) 203 lb (92.1 kg)     Height 03/30/20 1423 5\' 8"  (1.727 m)     Head Circumference --      Peak Flow --      Pain Score 03/30/20 1423 0     Pain Loc --      Pain Edu? --      Excl. in Spur? --    Vitals:   03/30/20 1836 03/30/20 1837  BP:    Pulse:  (!) 113  Resp:  20  Temp: 99.7 F (37.6 C)   SpO2:  96%   Physical Exam Vitals and nursing note reviewed.  Constitutional:      Appearance: He is well-developed.  HENT:     Head: Normocephalic and atraumatic.     Right Ear: External ear normal.     Left Ear: External ear normal.     Nose: Nose normal.     Mouth/Throat:     Pharynx: Posterior oropharyngeal erythema present.  Eyes:     Conjunctiva/sclera: Conjunctivae normal.  Cardiovascular:     Rate and Rhythm: Regular rhythm. Tachycardia present.     Heart sounds: No murmur heard.   Pulmonary:     Effort: Pulmonary effort is normal. No respiratory distress.     Breath sounds: Rhonchi present.  Abdominal:     Palpations: Abdomen is soft.     Tenderness: There is no abdominal tenderness.  Musculoskeletal:     Cervical back: Neck supple.     Right lower leg: No edema.     Left lower leg: No edema.  Skin:    General: Skin is warm and dry.     Capillary Refill: Capillary refill takes less than 2 seconds.  Neurological:     Mental Status: He is alert and oriented to person, place, and time.  Psychiatric:        Mood and Affect: Mood normal.      ____________________________________________   LABS (all labs ordered are listed, but only abnormal results are displayed)  Labs Reviewed  COMPREHENSIVE METABOLIC PANEL  - Abnormal; Notable for the following components:      Result Value   Glucose, Bld 114 (*)    Total Protein 8.3 (*)    All other components within normal limits  CBC WITH DIFFERENTIAL/PLATELET - Abnormal; Notable for the following components:   WBC 14.7 (*)    Neutro Abs 11.1 (*)    Monocytes Absolute 1.2 (*)    Abs Immature Granulocytes 0.09 (*)    All other components within normal limits  RESP PANEL BY RT PCR (RSV, FLU A&B, COVID)  URINE CULTURE  CULTURE, BLOOD (ROUTINE X  2)  CULTURE, BLOOD (ROUTINE X 2)  MAGNESIUM  LACTIC ACID, PLASMA  URINALYSIS, COMPLETE (UACMP) WITH MICROSCOPIC  LACTIC ACID, PLASMA  PROTIME-INR  APTT  TROPONIN I (HIGH SENSITIVITY)  TROPONIN I (HIGH SENSITIVITY)   ____________________________________________  EKG  Sinus tachycardia with a ventricular rate of 115, normal axis, unremarkable intervals, no evidence of acute ischemia or other underlying arrhythmia. ____________________________________________  RADIOLOGY  ED MD interpretation: My review of patient's chest x-ray and CT there are bilateral groundglass opacities and enlargement of the aorta with no pneumothorax, significant effusion, or edema.  Official radiology report(s): DG Chest 2 View  Result Date: 03/30/2020 CLINICAL DATA:  Chest pain, abnormal x-ray at outside clinic. EXAM: CHEST - 2 VIEW COMPARISON:  CT chest from 2008. FINDINGS: Trachea is midline. Cardiomediastinal contours with similar appearance with RIGHT-sided aortic arch and tortuosity of the aorta. Since previous imaging studies there is suggestion of further dilation of the aorta, difficult to quantify based on potential differences in projection when compared to prior studies. Appearing largely similar on the lateral view when compared to 2014 no sign of pleural effusion. Lungs are clear. Visualized skeletal structures are unremarkable. IMPRESSION: 1. No consolidation or effusion. 2. Question of further dilation of the thoracic  aorta in this patient with RIGHT-sided arch with some dilation of the proximal arch with diverticulum in the setting of RIGHT arch and aberrant LEFT subclavian artery. CT of the chest may be helpful for further assessment. Electronically Signed   By: Zetta Bills M.D.   On: 03/30/2020 15:28   CT ANGIO CHEST AORTA W/CM & OR WO/CM  Result Date: 03/30/2020 CLINICAL DATA:  Nonspecific chest pain. EXAM: CT ANGIOGRAPHY CHEST WITH CONTRAST TECHNIQUE: Multidetector CT imaging of the chest was performed using the standard protocol during bolus administration of intravenous contrast. Multiplanar CT image reconstructions and MIPs were obtained to evaluate the vascular anatomy. CONTRAST:  13mL OMNIPAQUE IOHEXOL 350 MG/ML SOLN COMPARISON:  Radiograph earlier today. Chest CT 04/12/2007, no interval chest CT. FINDINGS: Cardiovascular: Right-sided aortic arch as seen on prior exam. Aberrant left subclavian artery. Aortic dilatation/diverticulum at the takeoff of the right subclavian, 5.1 cm when measured on series 9, image 58. There is tortuosity of the transverse aorta with dilatation of the proximal descending measuring 4.4 cm, measured on sagittal series 10, image 50. On axial images this measures 4.5 cm and has progressed from prior exam where it measured 4 cm. Direct comparison is limited given lack of sagittal reformats on the prior exam. There is mild fusiform dilatation of the descending aorta at 4 cm. No dissection. No significant atherosclerosis. The aorta courses to the normal position in the upper abdomen to the left of the IVC. Pulmonary arteries are patent to the lobar level. The heart is normal in size. There is no pericardial effusion. Mild cardiac motion artifact partially obscures detailed cardiac anatomy. No pericardial effusion. Mediastinum/Nodes: No enlarged mediastinal or hilar lymph nodes. There is no thyroid nodule. Mild mass effect on the posterior esophagus from aortic dilatation. No esophageal wall  thickening. Lungs/Pleura: Mild compressive atelectasis in the medial right lung adjacent to the descending aorta. Patchy ground-glass and tree in bud opacities in the dependent right lower lobe. Mild central bronchial thickening, areas of mucous plugging in the right lower lobe. No pulmonary mass or suspicious nodule. No findings of pulmonary edema. No pleural fluid. Upper Abdomen: Suggestion of hepatic steatosis, not well assessed on arterial phase exam no acute findings. Musculoskeletal: There are no acute or suspicious osseous abnormalities.  Review of the MIP images confirms the above findings. 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 2. Thoracic aortic aneurysm. Aortic dilatation/diverticulum at the takeoff of the right subclavian artery measuring 5.1 cm. Maximal dimension of the proximal descending aorta measuring 4.5 cm, increased from prior exam where it measured 4 cm. No dissection or acute aortic findings. There is mild fusiform dilatation of the descending aorta at 4 cm. Recommend vascular surgery referral. Recommendations for arch aneurysms of this size are as follows: Recommend semi-annual imaging followup by CTA or MRA and referral to cardiothoracic surgery if not already obtained. This recommendation follows 2010 ACCF/AHA/AATS/ACR/ASA/SCA/SCAI/SIR/STS/SVM Guidelines for the Diagnosis and Management of Patients With Thoracic Aortic Disease. Circulation. 2010; 121: C588-F02. Aortic aneurysm NOS (ICD10-I71.9) 3. Dilated aortic causes mass effect on the esophagus. 4. Patchy ground-glass and tree in bud opacities in the dependent right lower lobe, likely infectious or inflammatory. 5. Mild central bronchial thickening, areas of mucous plugging in the right lower lobe. Electronically Signed   By: Keith Rake M.D.   On: 03/30/2020 16:45    ____________________________________________   PROCEDURES  Procedure(s) performed (including Critical Care):  .1-3 Lead EKG  Interpretation Performed by: Lucrezia Starch, MD Authorized by: Lucrezia Starch, MD     Interpretation: abnormal     ECG rate assessment: tachycardic     Rhythm: sinus rhythm     Ectopy: none     Conduction: normal       ____________________________________________   INITIAL IMPRESSION / ASSESSMENT AND PLAN / ED COURSE        While patient is noted to have significant marginal in his aorta and some irregularity of several the takeoff vessels there is no evidence of dissection, pneumothorax, pulmonary edema, or effusion noted.  Patient is known to be febrile and tachycardic on arrival and labs obtained via first look process show an elevated white count at 14.  Overall patient's history, exam, work-up is concerning for community-acquired pneumonia possibly initially viral with bacterial superinfection.  Low suspicion for ACS at this time.  Given several SIRS criteria on arrival with likely pulmonary source blood cultures were obtained, appropriate antibiotics were given, and patient was given IV fluids as well as Tylenol.  On reassessment patient's heart rate was noted to have decreased and his fever had defervesced.  Was given additional 500 cc of LR and given he has no evidence of poor perfusion to his extremities or hypotension into believe he requires further fluid fluid resuscitation at this time.  We will plan admit to hospital service for further evaluation management.  Medications  sodium chloride flush (NS) 0.9 % injection 3 mL (3 mLs Intravenous Not Given 03/30/20 1816)  azithromycin (ZITHROMAX) 500 mg in sodium chloride 0.9 % 250 mL IVPB (500 mg Intravenous New Bag/Given 03/30/20 1835)  lactated ringers bolus 500 mL (has no administration in time range)  iohexol (OMNIPAQUE) 350 MG/ML injection 75 mL (100 mLs Intravenous Contrast Given 03/30/20 1602)  acetaminophen (TYLENOL) tablet 1,000 mg (1,000 mg Oral Given 03/30/20 1755)  lactated ringers bolus 1,000 mL (1,000 mLs Intravenous  New Bag/Given 03/30/20 1755)  cefTRIAXone (ROCEPHIN) 1 g in sodium chloride 0.9 % 100 mL IVPB (0 g Intravenous Stopped 03/30/20 1833)             ____________________________________________   FINAL CLINICAL IMPRESSION(S) / ED DIAGNOSES  Final diagnoses:  None     ED Discharge Orders    None       Note:  This  document was prepared using Systems analyst and may include unintentional dictation errors.   Lucrezia Starch, MD 03/30/20 1859    Lucrezia Starch, MD 03/30/20 1900

## 2020-03-30 NOTE — ED Notes (Signed)
Pt reports that he has not had fever, temp at clinic was 98.7, RN rechecked temp and it was 99.3. Pts skin is not hot to touch

## 2020-03-30 NOTE — ED Notes (Signed)
Pt given ice chips

## 2020-03-30 NOTE — ED Notes (Signed)
Discussed pt with dr Archie Balboa, CT ordered.  CT will place IV.

## 2020-03-30 NOTE — ED Triage Notes (Signed)
Pt to ED from [facility] for abnormal x-ray. Pt states that he has had URI symptoms for 2 weeks, pt has taken a round of antibiotics, pt also given breathing treatment at [facility]. Pt had x-ray done today and states that the doctor seen something on the x-ray. Per first nurse, pt has dilated aorta. Pt has family history of aneurysm.   Pt noted to be febrile and tachy in triage.

## 2020-03-30 NOTE — ED Notes (Signed)
EDP at bedside  

## 2020-03-31 DIAGNOSIS — K219 Gastro-esophageal reflux disease without esophagitis: Secondary | ICD-10-CM

## 2020-03-31 DIAGNOSIS — J189 Pneumonia, unspecified organism: Secondary | ICD-10-CM

## 2020-03-31 DIAGNOSIS — A419 Sepsis, unspecified organism: Principal | ICD-10-CM

## 2020-03-31 DIAGNOSIS — I712 Thoracic aortic aneurysm, without rupture: Secondary | ICD-10-CM

## 2020-03-31 LAB — COMPREHENSIVE METABOLIC PANEL
ALT: 25 U/L (ref 0–44)
AST: 20 U/L (ref 15–41)
Albumin: 3.5 g/dL (ref 3.5–5.0)
Alkaline Phosphatase: 58 U/L (ref 38–126)
Anion gap: 8 (ref 5–15)
BUN: 13 mg/dL (ref 6–20)
CO2: 26 mmol/L (ref 22–32)
Calcium: 8.1 mg/dL — ABNORMAL LOW (ref 8.9–10.3)
Chloride: 101 mmol/L (ref 98–111)
Creatinine, Ser: 0.98 mg/dL (ref 0.61–1.24)
GFR calc Af Amer: >60 mL/min (ref >60)
GFR calc non Af Amer: >60 mL/min (ref >60)
Glucose, Bld: 110 mg/dL — ABNORMAL HIGH (ref 70–99)
Potassium: 3.9 mmol/L (ref 3.5–5.1)
Sodium: 135 mmol/L (ref 135–145)
Total Bilirubin: 0.8 mg/dL (ref 0.3–1.2)
Total Protein: 6.7 g/dL (ref 6.5–8.1)

## 2020-03-31 LAB — CBC WITH DIFFERENTIAL/PLATELET
Abs Immature Granulocytes: 0.05 10*3/uL (ref 0.00–0.07)
Basophils Absolute: 0.1 10*3/uL (ref 0.0–0.1)
Basophils Relative: 1 %
Eosinophils Absolute: 0.2 10*3/uL (ref 0.0–0.5)
Eosinophils Relative: 2 %
HCT: 38.4 % — ABNORMAL LOW (ref 39.0–52.0)
Hemoglobin: 12.8 g/dL — ABNORMAL LOW (ref 13.0–17.0)
Immature Granulocytes: 0 %
Lymphocytes Relative: 22 %
Lymphs Abs: 2.5 10*3/uL (ref 0.7–4.0)
MCH: 31.1 pg (ref 26.0–34.0)
MCHC: 33.3 g/dL (ref 30.0–36.0)
MCV: 93.4 fL (ref 80.0–100.0)
Monocytes Absolute: 1.1 10*3/uL — ABNORMAL HIGH (ref 0.1–1.0)
Monocytes Relative: 10 %
Neutro Abs: 7.3 10*3/uL (ref 1.7–7.7)
Neutrophils Relative %: 65 %
Platelets: 211 10*3/uL (ref 150–400)
RBC: 4.11 MIL/uL — ABNORMAL LOW (ref 4.22–5.81)
RDW: 13.1 % (ref 11.5–15.5)
WBC: 11.2 10*3/uL — ABNORMAL HIGH (ref 4.0–10.5)
nRBC: 0 % (ref 0.0–0.2)

## 2020-03-31 LAB — URINALYSIS, COMPLETE (UACMP) WITH MICROSCOPIC
Bacteria, UA: NONE SEEN
Bilirubin Urine: NEGATIVE
Glucose, UA: NEGATIVE mg/dL
Hgb urine dipstick: NEGATIVE
Ketones, ur: NEGATIVE mg/dL
Leukocytes,Ua: NEGATIVE
Nitrite: NEGATIVE
Protein, ur: NEGATIVE mg/dL
Specific Gravity, Urine: 1.008 (ref 1.005–1.030)
Squamous Epithelial / LPF: NONE SEEN (ref 0–5)
pH: 7 (ref 5.0–8.0)

## 2020-03-31 LAB — EXPECTORATED SPUTUM ASSESSMENT W GRAM STAIN, RFLX TO RESP C

## 2020-03-31 LAB — HIV ANTIBODY (ROUTINE TESTING W REFLEX): HIV Screen 4th Generation wRfx: NONREACTIVE

## 2020-03-31 LAB — STREP PNEUMONIAE URINARY ANTIGEN: Strep Pneumo Urinary Antigen: NEGATIVE

## 2020-03-31 MED ORDER — ACETAMINOPHEN 500 MG PO TABS
1000.0000 mg | ORAL_TABLET | Freq: Once | ORAL | Status: AC
  Administered 2020-03-31: 1000 mg via ORAL
  Filled 2020-03-31: qty 2

## 2020-03-31 MED ORDER — AZITHROMYCIN 250 MG PO TABS
250.0000 mg | ORAL_TABLET | Freq: Every day | ORAL | Status: DC
  Administered 2020-03-31: 250 mg via ORAL
  Filled 2020-03-31 (×2): qty 1

## 2020-03-31 MED ORDER — GUAIFENESIN-DM 100-10 MG/5ML PO SYRP
15.0000 mL | ORAL_SOLUTION | ORAL | Status: DC
  Administered 2020-03-31 – 2020-04-01 (×6): 15 mL via ORAL
  Filled 2020-03-31 (×6): qty 15

## 2020-03-31 NOTE — Progress Notes (Signed)
[facility]  - [place] at [facility]   PATIENT NAME: Harry Reynolds    MR#:  [id number]  DATE OF BIRTH:  1960/11/20  SUBJECTIVE:   Patient came in with increasing coughing spell. Fever 102. Found to have right lower lobe pneumonia. Feeling better. No shortness of breath. Still coughing up significant amount of phlegm. Remains afebrile today. REVIEW OF SYSTEMS:   Review of Systems  Constitutional: Negative for chills, fever and weight loss.  HENT: Negative for ear discharge, ear pain and nosebleeds.   Eyes: Negative for blurred vision, pain and discharge.  Respiratory: Positive for cough and sputum production. Negative for shortness of breath, wheezing and stridor.   Cardiovascular: Negative for chest pain, palpitations, orthopnea and PND.  Gastrointestinal: Negative for abdominal pain, diarrhea, nausea and vomiting.  Genitourinary: Negative for frequency and urgency.  Musculoskeletal: Negative for back pain and joint pain.  Neurological: Positive for weakness. Negative for sensory change, speech change and focal weakness.  Psychiatric/Behavioral: Negative for depression and hallucinations. The patient is not nervous/anxious.    Tolerating Diet:yes Tolerating PT: not needed  DRUG ALLERGIES:   Allergies  Allergen Reactions  . Codeine Nausea And Vomiting  . Other Nausea And Vomiting    Any kind of anesthesia makes patient sick    VITALS:  Blood pressure (!) 142/96, pulse 70, temperature 98 F (36.7 C), temperature source Oral, resp. rate 16, height 5\' 8"  (1.727 m), weight (!) 92.1 kg, SpO2 99 %.  PHYSICAL EXAMINATION:   Physical Exam  GENERAL:  59 y.o.-year-old patient lying in the bed with no acute distress.  EYES: Pupils equal, round, reactive to light and accommodation. No scleral icterus.   HEENT: Head atraumatic, normocephalic. Oropharynx and nasopharynx clear.  NECK:  Supple, no jugular venous distention. No thyroid enlargement, no tenderness.  LUNGS:  Normal breath sounds bilaterally, no wheezing, rales, rhonchi. No use of accessory muscles of respiration.  CARDIOVASCULAR: S1, S2 normal. No murmurs, rubs, or gallops.  ABDOMEN: Soft, nontender, nondistended. Bowel sounds present. No organomegaly or mass.  EXTREMITIES: No cyanosis, clubbing or edema b/l.    NEUROLOGIC: Cranial nerves II through XII are intact. No focal Motor or sensory deficits b/l.   PSYCHIATRIC:  patient is alert and oriented x 3.  SKIN: No obvious rash, lesion, or ulcer.   LABORATORY PANEL:  CBC Recent Labs  Lab 03/31/20 0430  WBC 11.2*  HGB 12.8*  HCT 38.4*  PLT 211    Chemistries  Recent Labs  Lab 03/30/20 1432 03/30/20 1723 03/31/20 0430  NA   < >  --  135  K   < >  --  3.9  CL   < >  --  101  CO2   < >  --  26  GLUCOSE   < >  --  110*  BUN   < >  --  13  CREATININE   < >  --  0.98  CALCIUM   < >  --  8.1*  MG  --  1.7  --   AST   < >  --  20  ALT   < >  --  25  ALKPHOS   < >  --  58  BILITOT   < >  --  0.8   < > = values in this interval not displayed.   Cardiac Enzymes No results for input(s): TROPONINI in the last 168 hours. RADIOLOGY:  DG Chest 2 View  Result Date: 03/30/2020 CLINICAL DATA:  Chest pain, abnormal x-ray at outside clinic. EXAM: CHEST - 2 VIEW COMPARISON:  CT chest from 2008. FINDINGS: Trachea is midline. Cardiomediastinal contours with similar appearance with RIGHT-sided aortic arch and tortuosity of the aorta. Since previous imaging studies there is suggestion of further dilation of the aorta, difficult to quantify based on potential differences in projection when compared to prior studies. Appearing largely similar on the lateral view when compared to 2014 no sign of pleural effusion. Lungs are clear. Visualized skeletal structures are unremarkable. IMPRESSION: 1. No consolidation or effusion. 2. Question of further dilation of the thoracic aorta in this patient with RIGHT-sided arch with some dilation of the proximal arch with  diverticulum in the setting of RIGHT arch and aberrant LEFT subclavian artery. CT of the chest may be helpful for further assessment. Electronically Signed   By: Zetta Bills M.D.   On: 03/30/2020 15:28   CT ANGIO CHEST AORTA W/CM & OR WO/CM  Result Date: 03/30/2020 CLINICAL DATA:  Nonspecific chest pain. EXAM: CT ANGIOGRAPHY CHEST WITH CONTRAST TECHNIQUE: Multidetector CT imaging of the chest was performed using the standard protocol during bolus administration of intravenous contrast. Multiplanar CT image reconstructions and MIPs were obtained to evaluate the vascular anatomy. CONTRAST:  156m OMNIPAQUE IOHEXOL 350 MG/ML SOLN COMPARISON:  Radiograph earlier today. Chest CT 04/12/2007, no interval chest CT. FINDINGS: Cardiovascular: Right-sided aortic arch as seen on prior exam. Aberrant left subclavian artery. Aortic dilatation/diverticulum at the takeoff of the right subclavian, 5.1 cm when measured on series 9, image 58. There is tortuosity of the transverse aorta with dilatation of the proximal descending measuring 4.4 cm, measured on sagittal series 10, image 50. On axial images this measures 4.5 cm and has progressed from prior exam where it measured 4 cm. Direct comparison is limited given lack of sagittal reformats on the prior exam. There is mild fusiform dilatation of the descending aorta at 4 cm. No dissection. No significant atherosclerosis. The aorta courses to the normal position in the upper abdomen to the left of the IVC. Pulmonary arteries are patent to the lobar level. The heart is normal in size. There is no pericardial effusion. Mild cardiac motion artifact partially obscures detailed cardiac anatomy. No pericardial effusion. Mediastinum/Nodes: No enlarged mediastinal or hilar lymph nodes. There is no thyroid nodule. Mild mass effect on the posterior esophagus from aortic dilatation. No esophageal wall thickening. Lungs/Pleura: Mild compressive atelectasis in the medial right lung adjacent  to the descending aorta. Patchy ground-glass and tree in bud opacities in the dependent right lower lobe. Mild central bronchial thickening, areas of mucous plugging in the right lower lobe. No pulmonary mass or suspicious nodule. No findings of pulmonary edema. No pleural fluid. Upper Abdomen: Suggestion of hepatic steatosis, not well assessed on arterial phase exam no acute findings. Musculoskeletal: There are no acute or suspicious osseous abnormalities. Review of the MIP images confirms the above findings. 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 2. Thoracic aortic aneurysm. Aortic dilatation/diverticulum at the takeoff of the right subclavian artery measuring 5.1 cm. Maximal dimension of the proximal descending aorta measuring 4.5 cm, increased from prior exam where it measured 4 cm. No dissection or acute aortic findings. There is mild fusiform dilatation of the descending aorta at 4 cm. Recommend vascular surgery referral. Recommendations for arch aneurysms of this size are as follows: Recommend semi-annual imaging followup by CTA or MRA and referral to cardiothoracic surgery if not already obtained. This recommendation follows 2010 ACCF/AHA/AATS/ACR/ASA/SCA/SCAI/SIR/STS/SVM Guidelines for the  Diagnosis and Management of Patients With Thoracic Aortic Disease. Circulation. 2010; 121: N053-Z76. Aortic aneurysm NOS (ICD10-I71.9) 3. Dilated aortic causes mass effect on the esophagus. 4. Patchy ground-glass and tree in bud opacities in the dependent right lower lobe, likely infectious or inflammatory. 5. Mild central bronchial thickening, areas of mucous plugging in the right lower lobe. Electronically Signed   By: Keith Rake M.D.   On: 03/30/2020 16:45   ASSESSMENT AND PLAN:  MARVYN TORREZ is a 59 y.o. male with medical history significant of GERD, postoperative nausea vomiting, morbid obesity who has not been following up with physicians on a regular basis.  Patient has  had productive cough some shortness of breath and chest tightness recently.  Also has sore throat and congestion. Patient therefore being admitted with community-acquired pneumonia.   #1 sepsis due to pneumonia: Patient has met SIRS criteria plus evidence of pneumonia.   -came in with fever of 102, tachycardia elevated wbc -received  IV fluids sepsis protocol.  - Empiric Rocephin and Zithromax.  - blood cultures so far negative -remains afebrile today. -Cough expectorant. -Flutter valve  #2 community-acquired pneumonia: Continue treatment as per above.  #3 GERD: Continue with PPIs  #4 obesity: Counseling provided   #5 thoracic aortic aneurysm incidental finding   Currently at 5.1.   -discussed with vascular surgery. Recommends outpatient vascular surgery follow-up -patient has been informed about his aneurysm.   DVT prophylaxis: Lovenox Code Status: Full code Family Communication:  patient Disposition Plan: Home Consults called: None Admission status: Inpatient   Dispo: The patient is from: Home              Anticipated d/c is to: Home              Anticipated d/c date is: 1 day              Patient currently is not medically stable to d/c. clinically patient improving. Will monitor one more night for fever. Change to oral antibiotics tomorrow and likely discharge.      TOTAL TIME TAKING CARE OF THIS PATIENT: 25* minutes.  >50% time spent on counselling and coordination of care  Note: This dictation was prepared with Dragon dictation along with smaller phrase technology. Any transcriptional errors that result from this process are unintentional.  Fritzi Mandes M.D    [facility]   CC: Primary care physician; Patient, No Pcp PerPatient ID: Joya Gaskins, male   DOB: 14-Jul-1961, 59 y.o.   MRN: [id number]

## 2020-03-31 NOTE — Plan of Care (Signed)
  Problem: Education: Goal: Knowledge of General Education information will improve Description Including pain rating scale, medication(s)/side effects and non-pharmacologic comfort measures Outcome: Progressing   

## 2020-03-31 NOTE — Progress Notes (Signed)
Pt admitted this shift. Oriented to room and unit. Pt has persistent cough that is productive. He reports headache and states he  'feels like he has an elephant on his chest". Pt on telemetry and O2 sats upper 90's.

## 2020-04-01 LAB — URINE CULTURE: Culture: NO GROWTH

## 2020-04-01 MED ORDER — LEVOFLOXACIN 750 MG PO TABS: 750.0000 mg | ORAL_TABLET | Freq: Every day | ORAL | 0 refills | Status: AC

## 2020-04-01 MED ORDER — LEVOFLOXACIN 500 MG PO TABS
750.0000 mg | ORAL_TABLET | Freq: Every day | ORAL | Status: DC
  Administered 2020-04-01: 750 mg via ORAL
  Filled 2020-04-01: qty 2

## 2020-04-01 NOTE — Progress Notes (Signed)
Discharge Note: Reviewed discharge instructions with pt. Pt verbalized understanding. Pt d/c with personal belongings and incentive spirometer. Pt transported by daughter to home via private transportation.

## 2020-04-01 NOTE — Discharge Summary (Signed)
[facility] NAME: Harry Reynolds    MR#:  [id number]  DATE OF BIRTH:  Apr 05, 1961  DATE OF ADMISSION:  03/30/2020 ADMITTING PHYSICIAN: Elwyn Reach, MD  DATE OF DISCHARGE: 04/01/2020  PRIMARY CARE PHYSICIAN: Patient, No Pcp Per    ADMISSION DIAGNOSIS:  CAP (community acquired pneumonia) [J18.9] Abnormality of thoracic aorta [Q25.40] Community acquired pneumonia, unspecified laterality [J18.9]  DISCHARGE DIAGNOSIS:  CAP Thoracic Aneurysm--incidental finding SECONDARY DIAGNOSIS:   Past Medical History:  Diagnosis Date  . Complication of anesthesia    patient reports nausea and vomiting after general anesthesia by history, no rashes   . GERD (gastroesophageal reflux disease)   . PONV (postoperative nausea and vomiting)     HOSPITAL COURSE:  Harry Reynolds a 59 y.o.malewith medical history significant ofGERD, postoperative nausea vomiting, morbid obesity who has not been following up with physicians on a regular basis. Patient has had productive cough some shortness of breath and chest tightness recently. Also has sore throat and congestion.Patient therefore being admitted with community-acquired pneumonia.   #1 sepsis due to pneumonia:Patient has met SIRS criteria plus evidence of pneumonia.  -now resolved -came in with fever of 102, tachycardia elevated wbc -received  IV fluids sepsis protocol.  -Empiric Rocephin and Zithromax--change to po levaquin 4 more days.  -blood cultures so far negative, sputum cx neg -remains afebrile today. -Cough expectorant. -Flutter valve +incentive spirometer  #2 community-acquired pneumonia:Continue treatment as per above.  #3 GERD:Continue with PPIs  #4 obesity:Counseling provided  Pt advised to get checked as outpt for OSA  #5 thoracic aortic aneurysm incidental finding  Currently at 5.1.  -discussed with vascular surgery Dr Lorenso Courier. Recommends outpatient vascular  surgery follow-up--wife given DR Dew's office no to call and make appt -patient and wife has been informed about his aneurysm.   DVT prophylaxis:Lovenox Code Status:Full code Family Communication: wife in the room Disposition Plan:Home Consults called:None Admission status:Inpatient   Dispo: The patient is from: Home  Anticipated d/c is to: Home  Anticipated d/c date is: today  Patient currently is medically stable to d/c. clinically patient improving. Pt agrees to go home today   CONSULTS OBTAINED:    DRUG ALLERGIES:   Allergies  Allergen Reactions  . Codeine Nausea And Vomiting  . Other Nausea And Vomiting    Any kind of anesthesia makes patient sick    DISCHARGE MEDICATIONS:   Allergies as of 04/01/2020      Reactions   Codeine Nausea And Vomiting   Other Nausea And Vomiting   Any kind of anesthesia makes patient sick      Medication List    TAKE these medications   ibuprofen 200 MG tablet Commonly known as: ADVIL Take 400 mg by mouth every 6 (six) hours as needed for headache.   levofloxacin 750 MG tablet Commonly known as: LEVAQUIN Take 1 tablet (750 mg total) by mouth daily for 4 days.       If you experience worsening of your admission symptoms, develop shortness of breath, life threatening emergency, suicidal or homicidal thoughts you must seek medical attention immediately by calling 911 or calling your MD immediately  if symptoms less severe.  You Must read complete instructions/literature along with all the possible adverse reactions/side effects for all the Medicines you take and that have been prescribed to you. Take any new Medicines after you have completely understood and accept all the possible adverse reactions/side effects.   Please note  You  were cared for by a hospitalist during your hospital stay. If you have any questions about your discharge medications or the care you received while you  were in the hospital after you are discharged, you can call the unit and asked to speak with the hospitalist on call if the hospitalist that took care of you is not available. Once you are discharged, your primary care physician will handle any further medical issues. Please note that NO REFILLS for any discharge medications will be authorized once you are discharged, as it is imperative that you return to your primary care physician (or establish a relationship with a primary care physician if you do not have one) for your aftercare needs so that they can reassess your need for medications and monitor your lab values. Today   SUBJECTIVE   Doing well  VITAL SIGNS:  Blood pressure (!) 143/110, pulse 92, temperature 98.2 F (36.8 C), temperature source Oral, resp. rate 20, height '5\' 8"'  (1.727 m), weight (!) 92.1 kg, SpO2 94 %.  I/O:    Intake/Output Summary (Last 24 hours) at 04/01/2020 0923 Last data filed at 04/01/2020 0526 Gross per 24 hour  Intake 2287.17 ml  Output 1475 ml  Net 812.17 ml    PHYSICAL EXAMINATION:  GENERAL:  59 y.o.-year-old patient lying in the bed with no acute distress. Obese EYES: Pupils equal, round, reactive to light and accommodation. No scleral icterus.  HEENT: Head atraumatic, normocephalic. Oropharynx and nasopharynx clear.  NECK:  Supple, no jugular venous distention. No thyroid enlargement, no tenderness.  LUNGS: Normal breath sounds bilaterally, no wheezing, rales,rhonchi or crepitation. No use of accessory muscles of respiration.  CARDIOVASCULAR: S1, S2 normal. No murmurs, rubs, or gallops.  ABDOMEN: Soft, non-tender, non-distended. Bowel sounds present. No organomegaly or mass.  EXTREMITIES: No pedal edema, cyanosis, or clubbing.  NEUROLOGIC: Cranial nerves II through XII are intact. Muscle strength 5/5 in all extremities. Sensation intact. Gait not checked.  PSYCHIATRIC: The patient is alert and oriented x 3.  SKIN: No obvious rash, lesion, or ulcer.    DATA REVIEW:   CBC  Recent Labs  Lab 03/31/20 0430  WBC 11.2*  HGB 12.8*  HCT 38.4*  PLT 211    Chemistries  Recent Labs  Lab 03/30/20 1432 03/30/20 1723 03/31/20 0430  NA   < >  --  135  K   < >  --  3.9  CL   < >  --  101  CO2   < >  --  26  GLUCOSE   < >  --  110*  BUN   < >  --  13  CREATININE   < >  --  0.98  CALCIUM   < >  --  8.1*  MG  --  1.7  --   AST   < >  --  20  ALT   < >  --  25  ALKPHOS   < >  --  58  BILITOT   < >  --  0.8   < > = values in this interval not displayed.    Microbiology Results   Recent Results (from the past 240 hour(s))  Blood culture (routine x 2)     Status: None (Preliminary result)   Collection Time: 03/30/20  4:50 PM   Specimen: BLOOD  Result Value Ref Range Status   Specimen Description BLOOD LEFT [facility]  Final   Special Requests   Final    BOTTLES DRAWN  AEROBIC AND ANAEROBIC Blood Culture adequate volume   Culture   Final    NO GROWTH 2 DAYS Performed at [facility], [address] [place], [place] [address]    Report Status PENDING  Incomplete  Blood culture (routine x 2)     Status: None (Preliminary result)   Collection Time: 03/30/20  4:59 PM   Specimen: BLOOD  Result Value Ref Range Status   Specimen Description BLOOD LEFT HAND  Final   Special Requests   Final    BOTTLES DRAWN AEROBIC AND ANAEROBIC Blood Culture adequate volume   Culture   Final    NO GROWTH 2 DAYS Performed at [facility], [address], [place], [place] [address]    Report Status PENDING  Incomplete  Resp Panel by RT PCR (RSV, Flu A&B, Covid) - Nasopharyngeal Swab     Status: None   Collection Time: 03/30/20  5:22 PM   Specimen: Nasopharyngeal Swab  Result Value Ref Range Status   SARS Coronavirus 2 by RT PCR NEGATIVE NEGATIVE Final    Comment: (NOTE) SARS-CoV-2 target nucleic acids are NOT DETECTED.  The SARS-CoV-2 RNA is generally detectable in upper respiratoy specimens during the acute phase of infection.  The lowest concentration of SARS-CoV-2 viral copies this assay can detect is 131 copies/mL. A negative result does not preclude SARS-Cov-2 infection and should not be used as the sole basis for treatment or other patient management decisions. A negative result may occur with  improper specimen collection/handling, submission of specimen other than nasopharyngeal swab, presence of viral mutation(s) within the areas targeted by this assay, and inadequate number of viral copies (<131 copies/mL). A negative result must be combined with clinical observations, patient history, and epidemiological information. The expected result is Negative.  Fact Sheet for Patients:  PinkCheek.be  Fact Sheet for Healthcare Providers:  GravelBags.it  This test is no t yet approved or cleared by the [place] FDA and  has been authorized for detection and/or diagnosis of SARS-CoV-2 by FDA under an Emergency Use Authorization (EUA). This EUA will remain  in effect (meaning this test can be used) for the duration of the COVID-19 declaration under Section 564(b)(1) of the Act, 21 U.S.C. section 360bbb-3(b)(1), unless the authorization is terminated or revoked sooner.     Influenza A by PCR NEGATIVE NEGATIVE Final   Influenza B by PCR NEGATIVE NEGATIVE Final    Comment: (NOTE) The Xpert Xpress SARS-CoV-2/FLU/RSV assay is intended as an aid in  the diagnosis of influenza from Nasopharyngeal swab specimens and  should not be used as a sole basis for treatment. Nasal washings and  aspirates are unacceptable for Xpert Xpress SARS-CoV-2/FLU/RSV  testing.  Fact Sheet for Patients: PinkCheek.be  Fact Sheet for Healthcare Providers: GravelBags.it  This test is not yet approved or cleared by the [place] FDA and  has been authorized for detection and/or diagnosis of SARS-CoV-2 by  FDA  under an Emergency Use Authorization (EUA). This EUA will remain  in effect (meaning this test can be used) for the duration of the  Covid-19 declaration under Section 564(b)(1) of the Act, 21  U.S.C. section 360bbb-3(b)(1), unless the authorization is  terminated or revoked.    Respiratory Syncytial Virus by PCR NEGATIVE NEGATIVE Final    Comment: (NOTE) Fact Sheet for Patients: PinkCheek.be  Fact Sheet for Healthcare Providers: GravelBags.it  This test is not yet approved or cleared by the [place] FDA and  has been authorized for detection and/or  diagnosis of SARS-CoV-2 by  FDA under an Emergency Use Authorization (EUA). This EUA will remain  in effect (meaning this test can be used) for the duration of the  COVID-19 declaration under Section 564(b)(1) of the Act, 21 U.S.C.  section 360bbb-3(b)(1), unless the authorization is terminated or  revoked. Performed at [facility], [address] [place], [place] [address]   Culture, blood (routine x 2) Call MD if unable to obtain prior to antibiotics being given     Status: None (Preliminary result)   Collection Time: 03/30/20  8:29 PM   Specimen: BLOOD  Result Value Ref Range Status   Specimen Description BLOOD BLOOD RIGHT HAND  Final   Special Requests   Final    BOTTLES DRAWN AEROBIC ONLY Blood Culture adequate volume   Culture   Final    NO GROWTH 2 DAYS Performed at [facility], [address] [place], [place] [address]    Report Status PENDING  Incomplete  Culture, blood (routine x 2) Call MD if unable to obtain prior to antibiotics being given     Status: None (Preliminary result)   Collection Time: 03/30/20  8:29 PM   Specimen: BLOOD  Result Value Ref Range Status   Specimen Description BLOOD RIGHT ANTECUBITAL  Final   Special Requests   Final    BOTTLES DRAWN AEROBIC ONLY Blood Culture adequate volume   Culture   Final    NO GROWTH  2 DAYS Performed at [facility], [address], [place], [place] [address]    Report Status PENDING  Incomplete  Culture, sputum-assessment     Status: None   Collection Time: 03/31/20  4:16 AM   Specimen: Expectorated Sputum  Result Value Ref Range Status   Specimen Description EXPECTORATED SPUTUM  Final   Special Requests NONE  Final   Sputum evaluation   Final    Sputum specimen not acceptable for testing.  Please recollect.   READ BACK AND VERIFIED WITH [facility] GLADDING '@[phone]'  03/31/2020 TTG Performed at [facility], [address] [place], [place] [address]    Report Status 03/31/2020 FINAL  Final    RADIOLOGY:  DG Chest 2 View  Result Date: 03/30/2020 CLINICAL DATA:  Chest pain, abnormal x-ray at outside clinic. EXAM: CHEST - 2 VIEW COMPARISON:  CT chest from 2008. FINDINGS: Trachea is midline. Cardiomediastinal contours with similar appearance with RIGHT-sided aortic arch and tortuosity of the aorta. Since previous imaging studies there is suggestion of further dilation of the aorta, difficult to quantify based on potential differences in projection when compared to prior studies. Appearing largely similar on the lateral view when compared to 2014 no sign of pleural effusion. Lungs are clear. Visualized skeletal structures are unremarkable. IMPRESSION: 1. No consolidation or effusion. 2. Question of further dilation of the thoracic aorta in this patient with RIGHT-sided arch with some dilation of the proximal arch with diverticulum in the setting of RIGHT arch and aberrant LEFT subclavian artery. CT of the chest may be helpful for further assessment. Electronically Signed   By: Zetta Bills M.D.   On: 03/30/2020 15:28   CT ANGIO CHEST AORTA W/CM & OR WO/CM  Result Date: 03/30/2020 CLINICAL DATA:  Nonspecific chest pain. EXAM: CT ANGIOGRAPHY CHEST WITH CONTRAST TECHNIQUE: Multidetector CT imaging of the chest was performed using the standard protocol during bolus  administration of intravenous contrast. Multiplanar CT image reconstructions and MIPs were obtained to evaluate the vascular anatomy. CONTRAST:  114m OMNIPAQUE IOHEXOL  350 MG/ML SOLN COMPARISON:  Radiograph earlier today. Chest CT 04/12/2007, no interval chest CT. FINDINGS: Cardiovascular: Right-sided aortic arch as seen on prior exam. Aberrant left subclavian artery. Aortic dilatation/diverticulum at the takeoff of the right subclavian, 5.1 cm when measured on series 9, image 58. There is tortuosity of the transverse aorta with dilatation of the proximal descending measuring 4.4 cm, measured on sagittal series 10, image 50. On axial images this measures 4.5 cm and has progressed from prior exam where it measured 4 cm. Direct comparison is limited given lack of sagittal reformats on the prior exam. There is mild fusiform dilatation of the descending aorta at 4 cm. No dissection. No significant atherosclerosis. The aorta courses to the normal position in the upper abdomen to the left of the IVC. Pulmonary arteries are patent to the lobar level. The heart is normal in size. There is no pericardial effusion. Mild cardiac motion artifact partially obscures detailed cardiac anatomy. No pericardial effusion. Mediastinum/Nodes: No enlarged mediastinal or hilar lymph nodes. There is no thyroid nodule. Mild mass effect on the posterior esophagus from aortic dilatation. No esophageal wall thickening. Lungs/Pleura: Mild compressive atelectasis in the medial right lung adjacent to the descending aorta. Patchy ground-glass and tree in bud opacities in the dependent right lower lobe. Mild central bronchial thickening, areas of mucous plugging in the right lower lobe. No pulmonary mass or suspicious nodule. No findings of pulmonary edema. No pleural fluid. Upper Abdomen: Suggestion of hepatic steatosis, not well assessed on arterial phase exam no acute findings. Musculoskeletal: There are no acute or suspicious osseous  abnormalities. Review of the MIP images confirms the above findings. 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 2. Thoracic aortic aneurysm. Aortic dilatation/diverticulum at the takeoff of the right subclavian artery measuring 5.1 cm. Maximal dimension of the proximal descending aorta measuring 4.5 cm, increased from prior exam where it measured 4 cm. No dissection or acute aortic findings. There is mild fusiform dilatation of the descending aorta at 4 cm. Recommend vascular surgery referral. Recommendations for arch aneurysms of this size are as follows: Recommend semi-annual imaging followup by CTA or MRA and referral to cardiothoracic surgery if not already obtained. This recommendation follows 2010 ACCF/AHA/AATS/ACR/ASA/SCA/SCAI/SIR/STS/SVM Guidelines for the Diagnosis and Management of Patients With Thoracic Aortic Disease. Circulation. 2010; 121: L275-T70. Aortic aneurysm NOS (ICD10-I71.9) 3. Dilated aortic causes mass effect on the esophagus. 4. Patchy ground-glass and tree in bud opacities in the dependent right lower lobe, likely infectious or inflammatory. 5. Mild central bronchial thickening, areas of mucous plugging in the right lower lobe. Electronically Signed   By: Keith Rake M.D.   On: 03/30/2020 16:45     CODE STATUS:     Code Status Orders  (From admission, onward)         Start     Ordered   03/30/20 2023  Full code  Continuous        03/30/20 2023        Code Status History    This patient has a current code status but no historical code status.   Advance Care Planning Activity       TOTAL TIME TAKING CARE OF THIS PATIENT: *35* minutes.    Fritzi Mandes M.D  [facility]    CC: Primary care physician; Patient, No Pcp Per

## 2020-04-04 LAB — CULTURE, BLOOD (ROUTINE X 2)
Culture: NO GROWTH
Culture: NO GROWTH
Culture: NO GROWTH
Culture: NO GROWTH
Special Requests: ADEQUATE
Special Requests: ADEQUATE
Special Requests: ADEQUATE
Special Requests: ADEQUATE

## 2020-04-17 ENCOUNTER — Ambulatory Visit (INDEPENDENT_AMBULATORY_CARE_PROVIDER_SITE_OTHER): Payer: Managed Care, Other (non HMO) | Admitting: Vascular Surgery

## 2020-04-17 ENCOUNTER — Encounter (INDEPENDENT_AMBULATORY_CARE_PROVIDER_SITE_OTHER): Payer: Self-pay | Admitting: Vascular Surgery

## 2020-04-17 ENCOUNTER — Other Ambulatory Visit: Payer: Self-pay

## 2020-04-17 VITALS — BP 122/82 | HR 99 | Resp 16 | Ht 68.0 in | Wt 207.6 lb

## 2020-04-17 DIAGNOSIS — K219 Gastro-esophageal reflux disease without esophagitis: Secondary | ICD-10-CM

## 2020-04-17 DIAGNOSIS — I10 Essential (primary) hypertension: Secondary | ICD-10-CM

## 2020-04-17 DIAGNOSIS — I712 Thoracic aortic aneurysm, without rupture, unspecified: Secondary | ICD-10-CM

## 2020-04-17 NOTE — Assessment & Plan Note (Signed)
Continue PPI as already ordered, this medication has been reviewed and there are no changes at this time. Avoidence of caffeine and alcohol Moderate elevation of the head of the bed  

## 2020-04-17 NOTE — Assessment & Plan Note (Addendum)
blood pressure control important in reducing the progression of atherosclerotic disease and aneurysmal growth.

## 2020-04-17 NOTE — Progress Notes (Signed)
Patient ID: Harry Reynolds, male   DOB: [birth year], 59 y.o.   MRN: [id number]  Chief Complaint  Patient presents with  . Follow-up    [facility] 2wk post ed visit    HPI Harry Reynolds is a 59 y.o. male.  I am asked to see the patient by Dr. Serita Grit for evaluation of a thoracic aortic aneurysm seen on the CT scan when he was admitted to the hospital 2 to 3 weeks ago.  The patient had no previous knowledge or history of an aneurysm although his father died of an aneurysm.  He reports no current chest pain or signs of peripheral embolization.  He does not smoke.   I have independently reviewed his CT angiogram of the chest from a couple of weeks ago.  This demonstrates an approximately 5.1 cm thoracic aortic aneurysm at the takeoff of the right subclavian artery.  He has interesting arch anatomy and that he has a right sided aortic arch and the enlarged area really correlates with the takeoff of the innominate artery.    Past Medical History:  Diagnosis Date  . Complication of anesthesia    patient reports nausea and vomiting after general anesthesia by history, no rashes   . GERD (gastroesophageal reflux disease)   . PONV (postoperative nausea and vomiting)     Past Surgical History:  Procedure Laterality Date  . COLONOSCOPY N/A 08/14/2014   MPN:TIRWER colonic polyp removed as described above.   . ESOPHAGEAL DILATION  08/14/2014   Procedure: ESOPHAGEAL DILATION;  Surgeon: Daneil Dolin, MD;  [facility]: [facility];  Service: Endoscopy;;  . ESOPHAGOGASTRODUODENOSCOPY N/A 07/29/2014   RMR: Limitied EGD;food impaction; status post disimpaction as described above.   . 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 Status post passage of a Maloney dilator and subsequent biopsy. Small hiatal hernia;otherwise normal EGD.      Family History  Problem Relation Age of Onset  . Diabetes Mother   . Heart disease Paternal Grandfather   . Colon cancer  Neg Hx   Father died from an aneurysm   Social History   Tobacco Use  . Smoking status: Never Smoker  . Smokeless tobacco: Never Used  Substance Use Topics  . Alcohol use: Yes    Alcohol/week: 1.0 standard drink    Types: 1 Cans of beer per week    Comment: maybe one beer every few weeks   . Drug use: No     Allergies  Allergen Reactions  . Codeine Nausea And Vomiting  . Other Nausea And Vomiting    Any kind of anesthesia makes patient sick    Current Outpatient Medications  Medication Sig Dispense Refill  . ibuprofen (ADVIL,MOTRIN) 200 MG tablet Take 400 mg by mouth every 6 (six) hours as needed for headache.     No current facility-administered medications for this visit.      REVIEW OF SYSTEMS (Negative unless checked)  Constitutional: [] Weight loss  [] Fever  [] Chills Cardiac: [] Chest pain   [] Chest pressure   [] Palpitations   [] Shortness of breath when laying flat   [] Shortness of breath at rest   [] Shortness of breath with exertion. Vascular:  [] Pain in legs with walking   [] Pain in legs at rest   [] Pain in legs when laying flat   [] Claudication   [] Pain in feet when walking  [] Pain in feet at rest  [] Pain in feet when laying flat   [] History of DVT   []   Phlebitis   [] Swelling in legs   [] Varicose veins   [] Non-healing ulcers Pulmonary:   [] Uses home oxygen   [] Productive cough   [] Hemoptysis   [] Wheeze  [] COPD   [] Asthma Neurologic:  [] Dizziness  [] Blackouts   [] Seizures   [] History of stroke   [] History of TIA  [] Aphasia   [] Temporary blindness   [] Dysphagia   [] Weakness or numbness in arms   [] Weakness or numbness in legs Musculoskeletal:  [] Arthritis   [] Joint swelling   [] Joint pain   [] Low back pain Hematologic:  [] Easy bruising  [] Easy bleeding   [] Hypercoagulable state   [] Anemic  [] Hepatitis Gastrointestinal:  [] Blood in stool   [] Vomiting blood  [] Gastroesophageal reflux/heartburn   [] Abdominal pain Genitourinary:  [] Chronic kidney disease   [] Difficult  urination  [] Frequent urination  [] Burning with urination   [] Hematuria Skin:  [] Rashes   [] Ulcers   [] Wounds Psychological:  [] History of anxiety   []  History of major depression.    Physical Exam BP 122/82 (BP Location: Right Arm)   Pulse 99   Resp 16   Ht 5\' 8"  (1.727 m)   Wt 207 lb 9.6 oz (94.2 kg)   BMI 31.57 kg/m  Gen:  WD/WN, NAD Head: [place]/AT, No temporalis wasting.  Ear/Nose/Throat: Hearing grossly intact, nares w/o erythema or drainage, oropharynx w/o Erythema/Exudate Eyes: Conjunctiva clear, sclera non-icteric  Neck: trachea midline.  No JVD.  Pulmonary:  Good air movement, respirations not labored, no use of accessory muscles  Cardiac: RRR, no JVD Vascular:  Vessel Right Left  Radial Palpable Palpable                                   Gastrointestinal:. No masses, surgical incisions, or scars. Musculoskeletal: M/S 5/5 throughout.  Extremities without ischemic changes.  No deformity or atrophy. No edema. Neurologic: Sensation grossly intact in extremities.  Symmetrical.  Speech is fluent. Motor exam as listed above. Psychiatric: Judgment intact, Mood & affect appropriate for pt's clinical situation. Dermatologic: No rashes or ulcers noted.  No cellulitis or open wounds.    Radiology DG Chest 2 View  Result Date: 03/30/2020 CLINICAL DATA:  Chest pain, abnormal x-ray at outside clinic. EXAM: CHEST - 2 VIEW COMPARISON:  CT chest from 2008. FINDINGS: Trachea is midline. Cardiomediastinal contours with similar appearance with RIGHT-sided aortic arch and tortuosity of the aorta. Since previous imaging studies there is suggestion of further dilation of the aorta, difficult to quantify based on potential differences in projection when compared to prior studies. Appearing largely similar on the lateral view when compared to 2014 no sign of pleural effusion. Lungs are clear. Visualized skeletal structures are unremarkable. IMPRESSION: 1. No consolidation or effusion. 2.  Question of further dilation of the thoracic aorta in this patient with RIGHT-sided arch with some dilation of the proximal arch with diverticulum in the setting of RIGHT arch and aberrant LEFT subclavian artery. CT of the chest may be helpful for further assessment. Electronically Signed   By: Zetta Bills M.D.   On: 03/30/2020 15:28   CT ANGIO CHEST AORTA W/CM & OR WO/CM  Result Date: 03/30/2020 CLINICAL DATA:  Nonspecific chest pain. EXAM: CT ANGIOGRAPHY CHEST WITH CONTRAST TECHNIQUE: Multidetector CT imaging of the chest was performed using the standard protocol during bolus administration of intravenous contrast. Multiplanar CT image reconstructions and MIPs were obtained to evaluate the vascular anatomy. CONTRAST:  121mL OMNIPAQUE IOHEXOL 350 MG/ML SOLN COMPARISON:  Radiograph earlier today. Chest CT 04/12/2007, no interval chest CT. FINDINGS: Cardiovascular: Right-sided aortic arch as seen on prior exam. Aberrant left subclavian artery. Aortic dilatation/diverticulum at the takeoff of the right subclavian, 5.1 cm when measured on series 9, image 58. There is tortuosity of the transverse aorta with dilatation of the proximal descending measuring 4.4 cm, measured on sagittal series 10, image 50. On axial images this measures 4.5 cm and has progressed from prior exam where it measured 4 cm. Direct comparison is limited given lack of sagittal reformats on the prior exam. There is mild fusiform dilatation of the descending aorta at 4 cm. No dissection. No significant atherosclerosis. The aorta courses to the normal position in the upper abdomen to the left of the IVC. Pulmonary arteries are patent to the lobar level. The heart is normal in size. There is no pericardial effusion. Mild cardiac motion artifact partially obscures detailed cardiac anatomy. No pericardial effusion. Mediastinum/Nodes: No enlarged mediastinal or hilar lymph nodes. There is no thyroid nodule. Mild mass effect on the posterior  esophagus from aortic dilatation. No esophageal wall thickening. Lungs/Pleura: Mild compressive atelectasis in the medial right lung adjacent to the descending aorta. Patchy ground-glass and tree in bud opacities in the dependent right lower lobe. Mild central bronchial thickening, areas of mucous plugging in the right lower lobe. No pulmonary mass or suspicious nodule. No findings of pulmonary edema. No pleural fluid. Upper Abdomen: Suggestion of hepatic steatosis, not well assessed on arterial phase exam no acute findings. Musculoskeletal: There are no acute or suspicious osseous abnormalities. Review of the MIP images confirms the above findings. 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 2. Thoracic aortic aneurysm. Aortic dilatation/diverticulum at the takeoff of the right subclavian artery measuring 5.1 cm. Maximal dimension of the proximal descending aorta measuring 4.5 cm, increased from prior exam where it measured 4 cm. No dissection or acute aortic findings. There is mild fusiform dilatation of the descending aorta at 4 cm. Recommend vascular surgery referral. Recommendations for arch aneurysms of this size are as follows: Recommend semi-annual imaging followup by CTA or MRA and referral to cardiothoracic surgery if not already obtained. This recommendation follows 2010 ACCF/AHA/AATS/ACR/ASA/SCA/SCAI/SIR/STS/SVM Guidelines for the Diagnosis and Management of Patients With Thoracic Aortic Disease. Circulation. 2010; 121: D622-W97. Aortic aneurysm NOS (ICD10-I71.9) 3. Dilated aortic causes mass effect on the esophagus. 4. Patchy ground-glass and tree in bud opacities in the dependent right lower lobe, likely infectious or inflammatory. 5. Mild central bronchial thickening, areas of mucous plugging in the right lower lobe. Electronically Signed   By: Keith Rake M.D.   On: 03/30/2020 16:45    Labs Recent Results (from the past 2160 hour(s))  Comprehensive metabolic  panel     Status: Abnormal   Collection Time: 03/30/20  2:32 PM  Result Value Ref Range   Sodium 140 135 - 145 mmol/L   Potassium 3.7 3.5 - 5.1 mmol/L   Chloride 102 98 - 111 mmol/L   CO2 27 22 - 32 mmol/L   Glucose, Bld 114 (H) 70 - 99 mg/dL    Comment: Glucose reference range applies only to samples taken after fasting for at least 8 hours.   BUN 16 6 - 20 mg/dL   Creatinine, Ser 1.06 0.61 - 1.24 mg/dL   Calcium 9.0 8.9 - 10.3 mg/dL   Total Protein 8.3 (H) 6.5 - 8.1 g/dL   Albumin 4.5 3.5 - 5.0 g/dL   AST 25 15 - 41 U/L  ALT 35 0 - 44 U/L   Alkaline Phosphatase 77 38 - 126 U/L   Total Bilirubin 1.0 0.3 - 1.2 mg/dL   GFR calc non Af Amer >60 >60 mL/min   GFR calc Af Amer >60 >60 mL/min   Anion gap 11 5 - 15    Comment: Performed at [facility], [address] [place], [place] [address]  CBC with Differential     Status: Abnormal   Collection Time: 03/30/20  2:32 PM  Result Value Ref Range   WBC 14.7 (H) 4.0 - 10.5 K/uL   RBC 4.89 4.22 - 5.81 MIL/uL   Hemoglobin 15.3 13.0 - 17.0 g/dL   HCT 44.3 39 - 52 %   MCV 90.6 80.0 - 100.0 fL   MCH 31.3 26.0 - 34.0 pg   MCHC 34.5 30.0 - 36.0 g/dL   RDW 13.0 11.5 - 15.5 %   Platelets 245 150 - 400 K/uL   nRBC 0.0 0.0 - 0.2 %   Neutrophils Relative % 74 %   Neutro Abs 11.1 (H) 1.7 - 7.7 K/uL   Lymphocytes Relative 13 %   Lymphs Abs 1.9 0.7 - 4.0 K/uL   Monocytes Relative 9 %   Monocytes Absolute 1.2 (H) 0 - 1 K/uL   Eosinophils Relative 2 %   Eosinophils Absolute 0.3 0 - 0 K/uL   Basophils Relative 1 %   Basophils Absolute 0.1 0 - 0 K/uL   Immature Granulocytes 1 %   Abs Immature Granulocytes 0.09 (H) 0.00 - 0.07 K/uL    Comment: Performed at [facility], [place], [place] [address]  Troponin I (High Sensitivity)     Status: None   Collection Time: 03/30/20  2:32 PM  Result Value Ref Range   Troponin I (High Sensitivity) 6 <18 ng/L    Comment: (NOTE) Elevated high sensitivity troponin I  (hsTnI) values and significant  changes across serial measurements may suggest ACS but many other  chronic and acute conditions are known to elevate hsTnI results.  Refer to the "Links" section for chest pain algorithms and additional  guidance. Performed at [facility], [address] [place], [place] [address]   Blood culture (routine x 2)     Status: None   Collection Time: 03/30/20  4:50 PM   Specimen: BLOOD  Result Value Ref Range   Specimen Description BLOOD LEFT AC    Special Requests      BOTTLES DRAWN AEROBIC AND ANAEROBIC Blood Culture adequate volume   Culture      NO GROWTH 5 DAYS Performed at [facility], [address] [place], [place] [address]    Report Status 04/04/2020 FINAL   Blood culture (routine x 2)     Status: None   Collection Time: 03/30/20  4:59 PM   Specimen: BLOOD  Result Value Ref Range   Specimen Description BLOOD LEFT HAND    Special Requests      BOTTLES DRAWN AEROBIC AND ANAEROBIC Blood Culture adequate volume   Culture      NO GROWTH 5 DAYS Performed at [facility], [address] [place], [place] [address]    Report Status 04/04/2020 FINAL   Resp Panel by RT PCR (RSV, Flu A&B, Covid) - Nasopharyngeal Swab     Status: None   Collection Time: 03/30/20  5:22 PM   Specimen: Nasopharyngeal Swab  Result Value Ref Range   SARS Coronavirus 2 by RT PCR NEGATIVE NEGATIVE  Comment: (NOTE) SARS-CoV-2 target nucleic acids are NOT DETECTED.  The SARS-CoV-2 RNA is generally detectable in upper respiratoy specimens during the acute phase of infection. The lowest concentration of SARS-CoV-2 viral copies this assay can detect is 131 copies/mL. A negative result does not preclude SARS-Cov-2 infection and should not be used as the sole basis for treatment or other patient management decisions. A negative result may occur with  improper specimen collection/handling, submission of specimen other than nasopharyngeal  swab, presence of viral mutation(s) within the areas targeted by this assay, and inadequate number of viral copies (<131 copies/mL). A negative result must be combined with clinical observations, patient history, and epidemiological information. The expected result is Negative.  Fact Sheet for Patients:  PinkCheek.be  Fact Sheet for Healthcare Providers:  GravelBags.it  This test is no t yet approved or cleared by the [place] FDA and  has been authorized for detection and/or diagnosis of SARS-CoV-2 by FDA under an Emergency Use Authorization (EUA). This EUA will remain  in effect (meaning this test can be used) for the duration of the COVID-19 declaration under Section 564(b)(1) of the Act, 21 U.S.C. section 360bbb-3(b)(1), unless the authorization is terminated or revoked sooner.     Influenza A by PCR NEGATIVE NEGATIVE   Influenza B by PCR NEGATIVE NEGATIVE    Comment: (NOTE) The Xpert Xpress SARS-CoV-2/FLU/RSV assay is intended as an aid in  the diagnosis of influenza from Nasopharyngeal swab specimens and  should not be used as a sole basis for treatment. Nasal washings and  aspirates are unacceptable for Xpert Xpress SARS-CoV-2/FLU/RSV  testing.  Fact Sheet for Patients: PinkCheek.be  Fact Sheet for Healthcare Providers: GravelBags.it  This test is not yet approved or cleared by the [place] FDA and  has been authorized for detection and/or diagnosis of SARS-CoV-2 by  FDA under an Emergency Use Authorization (EUA). This EUA will remain  in effect (meaning this test can be used) for the duration of the  Covid-19 declaration under Section 564(b)(1) of the Act, 21  U.S.C. section 360bbb-3(b)(1), unless the authorization is  terminated or revoked.    Respiratory Syncytial Virus by PCR NEGATIVE NEGATIVE    Comment: (NOTE) Fact Sheet for  Patients: PinkCheek.be  Fact Sheet for Healthcare Providers: GravelBags.it  This test is not yet approved or cleared by the [place] FDA and  has been authorized for detection and/or diagnosis of SARS-CoV-2 by  FDA under an Emergency Use Authorization (EUA). This EUA will remain  in effect (meaning this test can be used) for the duration of the  COVID-19 declaration under Section 564(b)(1) of the Act, 21 U.S.C.  section 360bbb-3(b)(1), unless the authorization is terminated or  revoked. Performed at [facility], [address] [place], [place] [address]   Magnesium     Status: None   Collection Time: 03/30/20  5:23 PM  Result Value Ref Range   Magnesium 1.7 1.7 - 2.4 mg/dL    Comment: Performed at [facility], [address] [place], [place] [address]  Lactic acid, plasma     Status: None   Collection Time: 03/30/20  5:30 PM  Result Value Ref Range   Lactic Acid, Venous 1.5 0.5 - 1.9 mmol/L    Comment: Performed at [facility], [address] [place],  [address]  Protime-INR     Status: None   Collection Time: 03/30/20  8:29 PM  Result Value Ref Range   Prothrombin Time 14.1 11.4 - 15.2  seconds   INR 1.1 0.8 - 1.2    Comment: (NOTE) INR goal varies based on device and disease states. Performed at [facility], [address] [place], [place] [address]   APTT     Status: None   Collection Time: 03/30/20  8:29 PM  Result Value Ref Range   aPTT 30 24 - 36 seconds    Comment: Performed at [facility], [address] [place], [place] [address]  Culture, blood (routine x 2) Call MD if unable to obtain prior to antibiotics being given     Status: None   Collection Time: 03/30/20  8:29 PM   Specimen: BLOOD  Result Value Ref Range   Specimen Description BLOOD BLOOD RIGHT HAND    Special Requests      BOTTLES DRAWN AEROBIC ONLY Blood Culture adequate  volume   Culture      NO GROWTH 5 DAYS Performed at [facility], [address] [place], [place] [address]    Report Status 04/04/2020 FINAL   Culture, blood (routine x 2) Call MD if unable to obtain prior to antibiotics being given     Status: None   Collection Time: 03/30/20  8:29 PM   Specimen: BLOOD  Result Value Ref Range   Specimen Description BLOOD RIGHT ANTECUBITAL    Special Requests      BOTTLES DRAWN AEROBIC ONLY Blood Culture adequate volume   Culture      NO GROWTH 5 DAYS Performed at [facility], [address] [place], [place] [address]    Report Status 04/04/2020 FINAL   HIV Antibody (routine testing w rflx)     Status: None   Collection Time: 03/30/20  8:29 PM  Result Value Ref Range   HIV Screen 4th Generation wRfx Non Reactive Non Reactive    Comment: Performed at [facility], [place] [address], [place], [place] [address]  Urinalysis, Complete w Microscopic     Status: Abnormal   Collection Time: 03/31/20  4:16 AM  Result Value Ref Range   Color, Urine STRAW (A) YELLOW   APPearance CLEAR (A) CLEAR   Specific Gravity, Urine 1.008 1.005 - 1.030   pH 7.0 5.0 - 8.0   Glucose, UA NEGATIVE NEGATIVE mg/dL   Hgb urine dipstick NEGATIVE NEGATIVE   Bilirubin Urine NEGATIVE NEGATIVE   Ketones, ur NEGATIVE NEGATIVE mg/dL   Protein, ur NEGATIVE NEGATIVE mg/dL   Nitrite NEGATIVE NEGATIVE   Leukocytes,Ua NEGATIVE NEGATIVE   WBC, UA 0-5 0 - 5 WBC/hpf   Bacteria, UA NONE SEEN NONE SEEN   Squamous Epithelial / LPF NONE SEEN 0 - 5   Mucus PRESENT     Comment: Performed at [facility], [address], [place], [place] [address]  Urine culture     Status: None   Collection Time: 03/31/20  4:16 AM   Specimen: In/Out Cath Urine  Result Value Ref Range   Specimen Description      IN/OUT CATH URINE Performed at [facility], [address], [place], [place] [address]    Special Requests      NONE Performed at [facility], [address] [place], [place] [address]    Culture      NO GROWTH Performed at [facility], [place] [address], [place], [place] [address]    Report Status 04/01/2020 FINAL   Culture, sputum-assessment     Status: None   Collection Time: 03/31/20  4:16  AM   Specimen: Expectorated Sputum  Result Value Ref Range   Specimen Description EXPECTORATED SPUTUM    Special Requests NONE    Sputum evaluation      Sputum specimen not acceptable for testing.  Please recollect.   READ BACK AND VERIFIED WITH DEBORAH GLADDING @637  03/31/2020 TTG Performed at [facility], [address] [place], [place] [address]    Report Status 03/31/2020 FINAL   Strep pneumoniae urinary antigen     Status: None   Collection Time: 03/31/20  4:16 AM  Result Value Ref Range   Strep Pneumo Urinary Antigen NEGATIVE NEGATIVE    Comment:        Infection due to S. pneumoniae cannot be absolutely ruled out since the antigen present may be below the detection limit of the test. Performed at [facility], [place] [address], [place], [place] [address]   Comprehensive metabolic panel     Status: Abnormal   Collection Time: 03/31/20  4:30 AM  Result Value Ref Range   Sodium 135 135 - 145 mmol/L   Potassium 3.9 3.5 - 5.1 mmol/L   Chloride 101 98 - 111 mmol/L   CO2 26 22 - 32 mmol/L   Glucose, Bld 110 (H) 70 - 99 mg/dL    Comment: Glucose reference range applies only to samples taken after fasting for at least 8 hours.   BUN 13 6 - 20 mg/dL   Creatinine, Ser 0.98 0.61 - 1.24 mg/dL   Calcium 8.1 (L) 8.9 - 10.3 mg/dL   Total Protein 6.7 6.5 - 8.1 g/dL   Albumin 3.5 3.5 - 5.0 g/dL   AST 20 15 - 41 U/L   ALT 25 0 - 44 U/L   Alkaline Phosphatase 58 38 - 126 U/L   Total Bilirubin 0.8 0.3 - 1.2 mg/dL   GFR calc non Af Amer >60 >60 mL/min   GFR calc Af Amer >60 >60 mL/min   Anion gap 8 5 - 15    Comment: Performed at [facility], [address] [place], [place] [address]   CBC WITH DIFFERENTIAL     Status: Abnormal   Collection Time: 03/31/20  4:30 AM  Result Value Ref Range   WBC 11.2 (H) 4.0 - 10.5 K/uL   RBC 4.11 (L) 4.22 - 5.81 MIL/uL   Hemoglobin 12.8 (L) 13.0 - 17.0 g/dL   HCT 38.4 (L) 39 - 52 %   MCV 93.4 80.0 - 100.0 fL   MCH 31.1 26.0 - 34.0 pg   MCHC 33.3 30.0 - 36.0 g/dL   RDW 13.1 11.5 - 15.5 %   Platelets 211 150 - 400 K/uL   nRBC 0.0 0.0 - 0.2 %   Neutrophils Relative % 65 %   Neutro Abs 7.3 1.7 - 7.7 K/uL   Lymphocytes Relative 22 %   Lymphs Abs 2.5 0.7 - 4.0 K/uL   Monocytes Relative 10 %   Monocytes Absolute 1.1 (H) 0 - 1 K/uL   Eosinophils Relative 2 %   Eosinophils Absolute 0.2 0 - 0 K/uL   Basophils Relative 1 %   Basophils Absolute 0.1 0 - 0 K/uL   Immature Granulocytes 0 %   Abs Immature Granulocytes 0.05 0.00 - 0.07 K/uL    Comment: Performed at [facility], [address] [place], [place] [address]    Assessment/Plan:  Essential hypertension blood pressure control important in reducing the progression of atherosclerotic disease and aneurysmal growth.   GERD (gastroesophageal  reflux disease) Continue PPI as already ordered, this medication has been reviewed and there are no changes at this time. Avoidence of caffeine and alcohol Moderate elevation of the head of the bed   Thoracic aortic aneurysm (Media) I have independently reviewed his CT angiogram of the chest from a couple of weeks ago.  This demonstrates an approximately 5.1 cm thoracic aortic aneurysm at the takeoff of the right subclavian artery.  He has interesting arch anatomy and that he has a right sided aortic arch and the enlarged area really correlates with the takeoff of the innominate artery.  There is no role for intervention at this size.  The most important factors at this point are blood pressure control and abstaining from tobacco which she is a lifetime non-smoker.  This will need to be monitored.  Given his relatively young age and  otherwise good health, this may end up needing to be repaired at some point, but I would not recommend repair at this time.  I discussed that at this point endovascular repair will be difficult due to the origins of the great vessels, but this could change as time goes on.  For now, we will plan a 78-month follow-up with a CT scan.      Leotis Pain 04/17/2020, 2:46 PM   This note was created with Dragon medical transcription system.  Any errors from dictation are unintentional.

## 2020-04-17 NOTE — Assessment & Plan Note (Signed)
I have independently reviewed his CT angiogram of the chest from a couple of weeks ago.  This demonstrates an approximately 5.1 cm thoracic aortic aneurysm at the takeoff of the right subclavian artery.  He has interesting arch anatomy and that he has a right sided aortic arch and the enlarged area really correlates with the takeoff of the innominate artery.  There is no role for intervention at this size.  The most important factors at this point are blood pressure control and abstaining from tobacco which she is a lifetime non-smoker.  This will need to be monitored.  Given his relatively young age and otherwise good health, this may end up needing to be repaired at some point, but I would not recommend repair at this time.  I discussed that at this point endovascular repair will be difficult due to the origins of the great vessels, but this could change as time goes on.  For now, we will plan a 83-month follow-up with a CT scan.

## 2020-04-17 NOTE — Patient Instructions (Signed)
Thoracic Aortic Aneurysm  An aneurysm is a bulge in an artery. It happens when blood pushes up against a weakened or damaged artery wall. A thoracic aortic aneurysm is an aneurysm that occurs in the first part of the aorta, between the heart and the diaphragm. The aorta is the main artery of the body. It supplies blood from the heart to the rest of the body. Some aneurysms may not cause symptoms or problems. However, a thoracic aortic aneurysm can cause two serious problems:  It can enlarge and burst (rupture).  It can cause blood to flow between the layers of the wall of the aorta through a tear (aortic dissection). Both of these problems are medical emergencies. They can cause bleeding inside the body and can be life-threatening if they are not diagnosed and treated right away. What are the causes? The exact cause of this condition is not known. What increases the risk? The following factors may make you more likely to develop this condition:  Being 65 years of age or older.  Having a family history of aneurysms.  Using tobacco.  Having any of these conditions: ? Hardening of the arteries caused by the buildup of fat and other substances in the lining of a blood vessel (arteriosclerosis). ? Inflammation of the walls of an artery (arteritis). ? A genetic disease that weakens the body's connective tissue, such as Marfan syndrome. ? An injury or trauma to the aorta. ? High blood pressure (hypertension). ? High cholesterol. ? An infection from bacteria, such as syphilis or staphylococcus, in the wall of the aorta (infectious aortitis). What are the signs or symptoms? Symptoms of this condition vary depending on the size of the aneurysm and how fast it is growing. Most grow slowly and do not cause symptoms. When symptoms do occur, they may include:  Pain in the chest, back, sides, or abdomen. The pain may vary in intensity. Sudden, severe pain may indicate that the aneurysm has ruptured.   Hoarseness.  Cough.  Shortness of breath.  Swallowing problems.  Swelling in the face, arms, or legs.  Fever.  Unexplained weight loss. How is this diagnosed? This condition may be diagnosed with:  An ultrasound.  X-rays.  CT scan.  MRI.  A test to check the arteries for damage or blockages (angiogram). Most unruptured thoracic aortic aneurysms cause no symptoms, so they are often found during exams for other conditions. How is this treated? Treatment for this condition depends on:  The size of the aneurysm.  How fast the aneurysm is growing.  Your age.  Risk factors for rupture. Small aneurysms (2.2 inches, or 5.5 cm, or less) may be managed with:  Medicines to: ? Control blood pressure. ? Manage pain. ? Fight infection.  Regular monitoring. This may include an ultrasound or CT scan every year or every 6 months to see if the aneurysm is getting bigger. Large or fast-growing aneurysms may be treated with surgery. Follow these instructions at home: Eating and drinking   Eat a healthy diet. Your health care provider may recommend that you: ? Lower your salt (sodium) intake. In some people, too much salt can raise blood pressure and increase the risk for thoracic aortic aneurysm. ? Avoid foods that are high in saturated fat and cholesterol, such as red meat and full-fat dairy. ? Eat a diet that is low in sugar. ? Increase your fiber intake by including whole grains, vegetables, and fruits in your diet. Eating these foods may help to lower your   blood pressure.  Do not drink alcohol if your health care provider tells you not to drink.  If you drink alcohol: ? Limit how much you use to:  0-1 drink a day for women.  0-2 drinks a day for men. ? Be aware of how much alcohol is in your drink. In the [place] one drink equals one 12 oz bottle of beer (355 mL), one 5 oz glass of wine (148 mL), or one 1 oz glass of hard liquor (44 mL). Lifestyle  Do not use any  products that contain nicotine or tobacco, such as cigarettes, e-cigarettes, and chewing tobacco. If you need help quitting, ask your health care provider.  Maintain a healthy weight.  Check your blood pressure regularly. Follow your health care provider's instructions on how to keep your blood pressure within normal limits.  Have your blood sugar (glucose) level and cholesterol levels checked regularly. Follow your health care provider's instructions on how to keep levels within normal limits. Activity   Stay physically active and exercise regularly. Talk with your health care provider about how often you should exercise and ask which types of exercise are best for you.  Avoid heavy lifting and activities that take a lot of effort. Ask your health care provider what activities are safe for you. General instructions  Take over-the-counter and prescription medicines only as told by your health care provider.  Talk with your health care provider about regular screenings to see if the aneurysm is getting bigger.  Keep all follow-up visits as told by your health care provider. This is important. Contact a health care provider if you have:  Unexplained weight loss. Get help right away if you have:  Pain in your upper back, neck, or abdomen. This pain may move into your chest and arms.  Trouble swallowing.  A cough or hoarseness.  Shortness of breath. Summary  A thoracic aortic aneurysm is an aneurysm that occurs in the first part of the aorta, between the heart and the diaphragm.  As a thoracic aortic aneurysm becomes larger, it can burst (rupture), or blood can flow between the layers of the wall of the aorta through a tear (aorticdissection). These conditions can be life-threatening if they are not diagnosed and treated right away.  If you have a thoracic aortic aneurysm, its growth will be closely monitored. Surgical repair may be needed for larger or faster-growing aneurysms.  This information is not intended to replace advice given to you by your health care provider. Make sure you discuss any questions you have with your health care provider. Document Revised: 04/06/2018 Document Reviewed: 04/07/2018 Elsevier Patient Education  2020 [employer] Inc.  

## 2020-04-20 ENCOUNTER — Ambulatory Visit (INDEPENDENT_AMBULATORY_CARE_PROVIDER_SITE_OTHER): Payer: Self-pay | Admitting: Vascular Surgery

## 2020-09-04 ENCOUNTER — Other Ambulatory Visit: Payer: Self-pay

## 2020-09-04 ENCOUNTER — Emergency Department
Admission: EM | Admit: 2020-09-04 | Discharge: 2020-09-04 | Disposition: A | Discharge status: Home / Self Care | Payer: Managed Care, Other (non HMO) | Attending: Emergency Medicine | Admitting: Emergency Medicine

## 2020-09-04 ENCOUNTER — Emergency Department: Payer: Managed Care, Other (non HMO)

## 2020-09-04 DIAGNOSIS — R079 Chest pain, unspecified: Secondary | ICD-10-CM

## 2020-09-04 DIAGNOSIS — I10 Essential (primary) hypertension: Secondary | ICD-10-CM | POA: Insufficient documentation

## 2020-09-04 DIAGNOSIS — U071 COVID-19: Secondary | ICD-10-CM | POA: Diagnosis not present

## 2020-09-04 LAB — BASIC METABOLIC PANEL
Anion gap: 11 (ref 5–15)
BUN: 17 mg/dL (ref 6–20)
CO2: 26 mmol/L (ref 22–32)
Calcium: 8.8 mg/dL — ABNORMAL LOW (ref 8.9–10.3)
Chloride: 101 mmol/L (ref 98–111)
Creatinine, Ser: 1.29 mg/dL — ABNORMAL HIGH (ref 0.61–1.24)
GFR, Estimated: >60 mL/min (ref >60)
Glucose, Bld: 103 mg/dL — ABNORMAL HIGH (ref 70–99)
Potassium: 4.2 mmol/L (ref 3.5–5.1)
Sodium: 138 mmol/L (ref 135–145)

## 2020-09-04 LAB — TROPONIN I (HIGH SENSITIVITY)
Troponin I (High Sensitivity): 7 ng/L (ref <18)
Troponin I (High Sensitivity): 7 ng/L (ref <18)

## 2020-09-04 LAB — CBC
HCT: 47.4 % (ref 39.0–52.0)
Hemoglobin: 15.8 g/dL (ref 13.0–17.0)
MCH: 30.7 pg (ref 26.0–34.0)
MCHC: 33.3 g/dL (ref 30.0–36.0)
MCV: 92 fL (ref 80.0–100.0)
Platelets: 192 10*3/uL (ref 150–400)
RBC: 5.15 MIL/uL (ref 4.22–5.81)
RDW: 12.9 % (ref 11.5–15.5)
WBC: 6.1 10*3/uL (ref 4.0–10.5)
nRBC: 0 % (ref 0.0–0.2)

## 2020-09-04 MED ORDER — IOHEXOL 350 MG/ML SOLN
75.0000 mL | Freq: Once | INTRAVENOUS | Status: AC | PRN
  Administered 2020-09-04: 75 mL via INTRAVENOUS
  Filled 2020-09-04: qty 75

## 2020-09-04 NOTE — ED Triage Notes (Signed)
Pt comes via POV from [facility] with c/o COVID+ just dx today and years of CP. Pt states they sent him here to get evaluated for possible blockage. Pt denies any current CP.  Pt states pain is worse with strenuous activity and that he does a lot of heavy lifting.

## 2020-09-04 NOTE — Discharge Instructions (Addendum)
1.  Return to the ER for new, worsening, or persistent severe chest pain, difficulty breathing, weakness, high fever, or any other new or worsening symptoms that concern you.  2.  Your CT does not show any change in your thoracic aortic aneurysm.  We will let Dr. Debroah Loop from vascular surgery know about the CT results and you should follow-up with him as instructed.  3.  You should follow-up with a cardiologist.  We have provided information for 2 of our affiliated cardiology practices, and you can call you want to make an appointment within the next few weeks.

## 2020-09-04 NOTE — ED Provider Notes (Signed)
[facility] Emergency Department Provider Note ____________________________________________   Event Date/Time   First MD Initiated Contact with Patient 09/04/20 1215     (approximate)  I have reviewed the triage vital signs and the nursing notes.   HISTORY  Chief Complaint Chest Pain and covid+    HPI Harry Reynolds is a 60 y.o. male with PMH as noted below including history of thoracic aortic aneurysm which is currently being monitored who presents with chest pain in the setting of a COVID-19 diagnosis.  The patient states that for the last several months he has had exertional chest pain.  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  He describes the pain as like a knot in his chest.  It only happens with exertion.  He states that the episodes have become more frequent, and over the last week he has felt them almost every day.  The patient denies associated shortness of breath although states he sometimes gets lightheaded.  Around 3 days ago, the patient started to have a sore throat, body aches, and headache.  He went to [facility] today and tested positive for Covid.  He was then sent to the ED for chest pain evaluation.    Past Medical History:  Diagnosis Date  . Complication of anesthesia    patient reports nausea and vomiting after general anesthesia by history, no rashes   . GERD (gastroesophageal reflux disease)   . PONV (postoperative nausea and vomiting)     Patient Active Problem List   Diagnosis Date Noted  . Essential hypertension 04/17/2020  . CAP (community acquired pneumonia) 03/30/2020  . Sepsis ([place]) 03/30/2020  . Thoracic aortic aneurysm ([place]) 03/30/2020  . Eosinophilic esophagitis [id number]  . GERD (gastroesophageal reflux disease) 12/08/2014  . History of colonic polyps   . Abnormality of esophagus   . Dysphagia, pharyngoesophageal phase   . Food impaction of esophagus     Past Surgical  History:  Procedure Laterality Date  . COLONOSCOPY N/A 08/14/2014   [id number] colonic polyp removed as described above.   . ESOPHAGEAL DILATION  08/14/2014   Procedure: ESOPHAGEAL DILATION;  Surgeon: Daneil Dolin, MD;  Location: [facility] SUITE;  Service: Endoscopy;;  . ESOPHAGOGASTRODUODENOSCOPY N/A 07/29/2014   RMR: Limitied EGD;food impaction; status post disimpaction as described above.   . 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 Status post passage of a Maloney dilator and subsequent biopsy. Small hiatal hernia;otherwise normal EGD.     Prior to Admission medications   Medication Sig Start Date End Date Taking? Authorizing Provider  ibuprofen (ADVIL,MOTRIN) 200 MG tablet Take 400 mg by mouth every 6 (six) hours as needed for headache.    [provider]    Allergies Codeine and Other  Family History  Problem Relation Age of Onset  . Diabetes Mother   . Heart disease Paternal Grandfather   . Colon cancer Neg Hx     Social History Social History   Tobacco Use  . Smoking status: Never Smoker  . Smokeless tobacco: Never Used  Substance Use Topics  . Alcohol use: Yes    Alcohol/week: 1.0 standard drink    Types: 1 Cans of beer per week    Comment: maybe one beer every few weeks   . Drug use: No    Review of Systems  Constitutional: No fever/chills. Eyes: No visual changes. ENT: Positive for sore throat. Cardiovascular: Positive  for intermittent chest pain. Respiratory: Denies shortness of breath. Gastrointestinal: No vomiting or diarrhea.  Genitourinary: Negative for dysuria.  Musculoskeletal: Negative for back pain. Skin: Negative for rash. Neurological: Negative for headache, focal weakness or numbness.   ____________________________________________   PHYSICAL EXAM:  VITAL SIGNS: ED Triage Vitals  Enc Vitals Group     BP 09/04/20 1121 128/84     Pulse Rate 09/04/20 1121 96     Resp --       Temp 09/04/20 1121 98.7 F (37.1 C)     Temp src --      SpO2 09/04/20 1121 96 %     Weight --      Height --      Head Circumference --      Peak Flow --      Pain Score 09/04/20 1123 0     Pain Loc --      Pain Edu? --      Excl. in [place]? --     Constitutional: Alert and oriented. Well appearing and in no acute distress. Eyes: Conjunctivae are normal.  Head: Atraumatic. Nose: No congestion/rhinnorhea. Mouth/Throat: Mucous membranes are moist.   Neck: Normal range of motion.  Cardiovascular: Normal rate, regular rhythm.   Good peripheral circulation. Respiratory: Normal respiratory effort.  No retractions.  Gastrointestinal: No distention.  Musculoskeletal: Extremities warm and well perfused.  Neurologic:  Normal speech and language. No gross focal neurologic deficits are appreciated.  Skin:  Skin is warm and dry. No rash noted. Psychiatric: Mood and affect are normal. Speech and behavior are normal.  ____________________________________________   LABS (all labs ordered are listed, but only abnormal results are displayed)  Labs Reviewed  BASIC METABOLIC PANEL - Abnormal; Notable for the following components:      Result Value   Glucose, Bld 103 (*)    Creatinine, Ser 1.29 (*)    Calcium 8.8 (*)    All other components within normal limits  CBC  TROPONIN I (HIGH SENSITIVITY)  TROPONIN I (HIGH SENSITIVITY)   ____________________________________________  EKG  ED ECG REPORT I, Arta Silence, the attending physician, personally viewed and interpreted this ECG.  Date: 09/04/2020 EKG Time: 1114 Rate: 101 Rhythm: normal sinus rhythm QRS Axis: normal Intervals: normal ST/T Wave abnormalities: Nonspecific T wave abnormalities inferiorly Narrative Interpretation: Nonspecific abnormalities with no evidence of acute ischemia; no significant change when compared to EKG of 03/30/2020  ____________________________________________  RADIOLOGY  CT angio aorta: No acute  findings.  No change in thoracic aortic aneurysm compared to last CT.  ____________________________________________   PROCEDURES  Procedure(s) performed: No  Procedures  Critical Care performed: No ____________________________________________   INITIAL IMPRESSION / ASSESSMENT AND PLAN / ED COURSE  Pertinent labs & imaging results that were available during my care of the patient were reviewed by me and considered in my medical decision making (see chart for details).  60 year old male with PMH as noted above including a known thoracic aortic aneurysm presents with intermittent exertional chest pain which is worsened over the last 1 to 2 weeks.  He also has some URI symptoms and was diagnosed with Covid today.  I reviewed the past medical records in [place] in [place].  The patient was seen at [facility] clinic today and tested positive for COVID-19.  He was sent to the ED for evaluation of chest pain.  Previously he was admitted in July with pneumonia.  CT at that time found an incidental finding of a 5 cm thoracic aortic  aneurysm.  The patient was seen by Dr. Wyn Quaker from vascular surgery and at that time it was determined that there is no indication for intervention.  He is scheduled for a 23-month follow-up CT later this month.  On exam the patient is well-appearing.  His vital signs are normal.  The physical exam is unremarkable.  EKG shows a minimal T wave inversion in lead III but no ischemic changes.  The patient has no history of CAD and overall the presentation is not consistent with ACS.  Given that he has no pain currently and it has remained intermittent, there is no clinical evidence for aortic dissection or other acute complication of his thoracic aneurysm.  He has no tachycardia or hypoxia and no clinical evidence for PE.  Presentation is most consistent with musculoskeletal pain possibly exacerbated by Covid.  Initial lab work-up including troponin is negative.  We will  obtain a repeat troponin to rule out ACS.  Given that he is scheduled for an outpatient CT follow-up in a few weeks and has worsening symptoms, we will obtain a CT angio today.  ----------------------------------------- 4:16 PM on 09/04/2020 -----------------------------------------  CT angio shows no acute abnormalities and no change in the thoracic aortic aneurysm when compared to last summer.  Troponins are negative x2.  The patient has no active chest pain.  There is no evidence of ACS.  He is stable for discharge home.  I have informed Dr. Wyn Quaker about the CT findings.  I have referred the patient for outpatient cardiology follow-up.  Return precautions given, and he expresses understanding.  ____________________________________________   FINAL CLINICAL IMPRESSION(S) / ED DIAGNOSES  Final diagnoses:  Nonspecific chest pain      NEW MEDICATIONS STARTED DURING THIS VISIT:  New Prescriptions   No medications on file     Note:  This document was prepared using Dragon voice recognition software and may include unintentional dictation errors.    Dionne Bucy, MD 09/04/20 [phone]

## 2021-11-24 ENCOUNTER — Emergency Department
Admission: EM | Admit: 2021-11-24 | Discharge: 2021-11-24 | Disposition: A | Discharge status: Home / Self Care | Payer: Managed Care, Other (non HMO) | Attending: Emergency Medicine | Admitting: Emergency Medicine

## 2021-11-24 ENCOUNTER — Encounter: Payer: Self-pay | Admitting: Emergency Medicine

## 2021-11-24 ENCOUNTER — Emergency Department: Payer: Managed Care, Other (non HMO)

## 2021-11-24 ENCOUNTER — Other Ambulatory Visit: Payer: Self-pay

## 2021-11-24 DIAGNOSIS — W010XXA Fall on same level from slipping, tripping and stumbling without subsequent striking against object, initial encounter: Secondary | ICD-10-CM | POA: Insufficient documentation

## 2021-11-24 DIAGNOSIS — M5416 Radiculopathy, lumbar region: Secondary | ICD-10-CM | POA: Diagnosis not present

## 2021-11-24 DIAGNOSIS — Y990 Civilian activity done for income or pay: Secondary | ICD-10-CM | POA: Insufficient documentation

## 2021-11-24 DIAGNOSIS — M79662 Pain in left lower leg: Secondary | ICD-10-CM | POA: Diagnosis present

## 2021-11-24 MED ORDER — OXYCODONE-ACETAMINOPHEN 5-325 MG PO TABS: 1.0000 | ORAL_TABLET | Freq: Four times a day (QID) | ORAL | 0 refills | Status: AC | PRN

## 2021-11-24 MED ORDER — ONDANSETRON 4 MG PO TBDP: 4.0000 mg | ORAL_TABLET | Freq: Three times a day (TID) | ORAL | 0 refills | Status: AC | PRN

## 2021-11-24 MED ORDER — ONDANSETRON 4 MG PO TBDP
4.0000 mg | ORAL_TABLET | Freq: Once | ORAL | Status: AC
  Administered 2021-11-24: 4 mg via ORAL
  Filled 2021-11-24: qty 1

## 2021-11-24 MED ORDER — PREDNISONE 20 MG PO TABS
60.0000 mg | ORAL_TABLET | Freq: Once | ORAL | Status: AC
  Administered 2021-11-24: 60 mg via ORAL
  Filled 2021-11-24: qty 3

## 2021-11-24 MED ORDER — FENTANYL CITRATE PF 50 MCG/ML IJ SOSY
50.0000 ug | PREFILLED_SYRINGE | Freq: Once | INTRAMUSCULAR | Status: AC
  Administered 2021-11-24: 50 ug via INTRAMUSCULAR
  Filled 2021-11-24: qty 1

## 2021-11-24 MED ORDER — PREDNISONE 10 MG (21) PO TBPK: ORAL_TABLET | ORAL | 0 refills | Status: AC

## 2021-11-24 MED ORDER — OXYCODONE-ACETAMINOPHEN 5-325 MG PO TABS
1.0000 | ORAL_TABLET | ORAL | Status: DC | PRN
  Administered 2021-11-24: 1 via ORAL
  Filled 2021-11-24 (×2): qty 1

## 2021-11-24 NOTE — ED Provider Notes (Signed)
? ?[facility] ?Provider Note ? ?Patient Contact: 7:48 PM (approximate) ? ? ?History  ? ?Fall ? ? ?HPI ? ?Harry Reynolds is a 61 y.o. male presents to the emergency department with acute left lower extremity pain after a fall.  Patient states that he slipped and fell at work on Friday.  Patient states that he has pain that radiates along the anterior aspect of his leg to his feet and has had difficulty bearing weight.  He denies bowel or bladder incontinence or saddle anesthesia. ? ?  ? ? ?Physical Exam  ? ?Triage Vital Signs: ?ED Triage Vitals [11/24/21 1833]  ?Enc Vitals Group  ?   BP (!) 188/98  ?   Pulse Rate 86  ?   Resp 20  ?   Temp 98.8 ?F (37.1 ?C)  ?   Temp Source Oral  ?   SpO2 96 %  ?   Weight 198 lb (89.8 kg)  ?   Height '5\' 8"'$  (1.727 m)  ?   Head Circumference   ?   Peak Flow   ?   Pain Score 10  ?   Pain Loc   ?   Pain Edu?   ?   Excl. in [place]?   ? ? ?Most recent vital signs: ?Vitals:  ? 11/24/21 1833  ?BP: (!) 188/98  ?Pulse: 86  ?Resp: 20  ?Temp: 98.8 ?F (37.1 ?C)  ?SpO2: 96%  ? ? ? ?General: Alert and in no acute distress. ?Eyes:  PERRL. EOMI. ?Head: No acute traumatic findings ?ENT: ?     Nose: No congestion/rhinnorhea. ?     Mouth/Throat: Mucous membranes are moist. ?Neck: No stridor. No cervical spine tenderness to palpation. ?Cardiovascular:  Good peripheral perfusion ?Respiratory: Normal respiratory effort without tachypnea or retractions. Lungs CTAB. Good air entry to the bases with no decreased or absent breath sounds. ?Gastrointestinal: Bowel sounds ?4 quadrants. Soft and nontender to palpation. No guarding or rigidity. No palpable masses. No distention. No CVA tenderness. ?Musculoskeletal: Patient has symmetric strength in the lower extremities.  Full range of motion to all extremities.  Positive straight leg raise test, left. ?Neurologic:  No gross focal neurologic deficits are appreciated.  ?Skin:   No rash noted ?Other: ? ? ?ED Results / Procedures / Treatments   ? ?Labs ?(all labs ordered are listed, but only abnormal results are displayed) ?Labs Reviewed - No data to display ? ? ? ? ? ?RADIOLOGY ? ?I personally viewed and evaluated these images as part of my medical decision making, as well as reviewing the written report by the radiologist. ? ?ED Provider Interpretation: I personally reviewed CTs of the lumbar spine and left hip and agree with radiologist interpretation.  No acute bony abnormality was visualized. ? ? ?PROCEDURES: ? ?Critical Care performed: No ? ?Procedures ? ? ?MEDICATIONS ORDERED IN ED: ?Medications  ?oxyCODONE-acetaminophen (PERCOCET/ROXICET) 5-325 MG per tablet 1 tablet (1 tablet Oral Given 11/24/21 1837)  ?fentaNYL (SUBLIMAZE) injection 50 mcg (50 mcg Intramuscular Given 11/24/21 2034)  ?ondansetron (ZOFRAN-ODT) disintegrating tablet 4 mg (4 mg Oral Given 11/24/21 2033)  ?predniSONE (DELTASONE) tablet 60 mg (60 mg Oral Given 11/24/21 2111)  ? ? ? ?IMPRESSION / MDM / ASSESSMENT AND PLAN / ED COURSE  ?I reviewed the triage vital signs and the nursing notes. ?             ?               ? ?Differential diagnosis  includes, but is not limited to, lumbar radiculopathy, compression fracture, occult hip fracture... ? ?Assessment and plan ?Radiculopathy ?61 year old male presents to the emergency department with worsening lower extremity pain after mechanical fall.  Patient describes pain as a sharp burning sensation which radiates along anterior aspect of left lower extremity to foot. ? ?Patient was hypertensive at triage but vital signs otherwise reassuring.  He was alert, active and nontoxic-appearing but did seem in severe pain.  Patient was given an injection of fentanyl and his pain improved significantly. ? ?CTs of the lumbar spine and left hip were obtained which showed no acute abnormality. ? ?Patient was given his first dose of prednisone in the emergency department and started on tapered prednisone and Robaxin.  Return precautions were given to  return with new or worsening symptoms.  Outpatient follow-up with neurosurgery as recommended. ?  ? ? ?FINAL CLINICAL IMPRESSION(S) / ED DIAGNOSES  ? ?Final diagnoses:  ?Lumbar radiculopathy  ? ? ? ?Rx / DC Orders  ? ?ED Discharge Orders   ? ?      Ordered  ?  oxyCODONE-acetaminophen (PERCOCET/ROXICET) 5-325 MG tablet  Every 6 hours PRN       ? 11/24/21 2104  ?  ondansetron (ZOFRAN-ODT) 4 MG disintegrating tablet  Every 8 hours PRN       ? 11/24/21 2104  ?  predniSONE (STERAPRED UNI-PAK 21 TAB) 10 MG (21) TBPK tablet       ? 11/24/21 2110  ? ?  ?  ? ?  ? ? ? ?Note:  This document was prepared using Dragon voice recognition software and may include unintentional dictation errors. ?  ?Lannie Fields, PA-C ?11/24/21 2115 ? ?  ?Blake Divine, MD ?11/28/21 1042 ? ?

## 2021-11-24 NOTE — Discharge Instructions (Addendum)
Take tapered steroid as directed. ?Take Percocet for pain. ?If symptoms not improved this week, please make follow-up appointment with neurosurgery. ?

## 2021-11-24 NOTE — ED Triage Notes (Signed)
Pt via POV from home. Pt states he slipped and fell at work on Friday. Pt states the pain is getting worse. Pt c/o L hip pain. Denies head injury. Denies LOC. Pt is A&OX4 and but seem uncomfortable from the pain.  ?

## 2021-11-24 NOTE — ED Notes (Addendum)
Pt states he slipped on Friday and has been having hip pain in his left hip and radiates all down to his left foot. Pt denies hitting his head or LOC.  ?

## 2022-06-30 ENCOUNTER — Encounter (INDEPENDENT_AMBULATORY_CARE_PROVIDER_SITE_OTHER): Payer: Self-pay

## 2022-09-16 NOTE — Progress Notes (Signed)
 Referring Physician: CHad Hughes Primary Care Physician: Roseann Denise Ned, MD [address] [facility] [place] [address] [phone]  Patient Identification: Harry Reynolds is a 62 y.o. male with a history PE noted in 7/22 in right interloabar PA occurred following 2 large endovascular surgeries to aortic arch area. Noted on routine survellance.  We placed him on anticoagulant and VQ in 12/22 was low probability.  Problem List  Date Reviewed: 12/03/2021          ICD-10-CM Priority Class Noted - Resolved   Multiple subsegmental pulmonary emboli without acute cor pulmonale (CMS-HCC) I26.94   05/12/2021 - Present   Overview Addendum 05/12/2021  7:29 PM by Edda Jerel Caldron, MD    CT done to assess aortic grafts 03/26/21 compared to early 7/22 Acute pulmonary embolus to the right interlobar pulmonary artery with extension into the right middle lobe and common basal trunk with distal extension into the subsegmental pulmonary arteries..A persistent filling defect within the right internal jugular vein seen on arterial and delayed phase imaging. There is a filling defect within the right external jugular vein.  TEE during TEVAR 03/12/21 1. normal LV size and function, dilated RV, mild RV dysfunction, no RWMA 2. Mild TR, trace MR, PI, no AI/AS 3. No PFO 4. 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      Dysphagia, pharyngoesophageal phase R13.14   01/16/2021 -  Present   Food impaction of esophagus T18.128A, W44.F3XA   01/16/2021 - Present   Gastroesophageal reflux disease with esophagitis, unspecified whether hemorrhage K21.00   01/16/2021 - Present   Sleep apnea G47.30   01/16/2021 - Present   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      Thoracic aortic aneurysm (CMS-HCC) I71.20   03/30/2020 - Present   Overview Signed 01/16/2021  5:56 PM by Nyla Charlie Sharper, PA    Last Assessment & Plan:  Formatting of this note might be different from the original. I have independently reviewed his CT angiogram of the chest from a couple of weeks ago.  This demonstrates an approximately 5.1 cm thoracic aortic aneurysm at the takeoff of the right subclavian artery.  He has interesting arch anatomy and that he has a right sided aortic arch and the enlarged area really correlates with the takeoff of the innominate artery.  There is no role for intervention at this size.  The most important factors at this point are blood pressure control and abstaining from tobacco which she is a lifetime non-smoker.  This will need to be monitored.  Given his relatively young age and otherwise good health, this may  end up needing to be repaired at some point, but I would not recommend repair at this time.  I discussed that at this point endovascular repair will be difficult due to the origins of the great vessels, but this could change as time goes on.  For now, we will plan a 6-month follow-up with a CT scan.      CAP (community acquired pneumonia) J18.9   03/30/2020 - Present   GERD (gastroesophageal reflux disease) K21.9   12/08/2014 - Present   Overview Signed 09/04/2020  9:13 AM by  Gretel Rosina Cohens, PA    Last Assessment & Plan:  Formatting of this note might be different from the original. Continue PPI as already ordered, this medication has been reviewed and there are no changes at this time. Avoidence of caffeine and alcohol Moderate elevation of the head of the bed      RESOLVED: Abdominal aortic aneurysm without rupture (CMS-HCC) I71.40   09/04/2020 - 01/16/2021    Interval History [name] presents for follow up of prior PE.  Resolved by VQ about 5 months post surgical procedures.  He had self discontinued his anticoagulant as he had some lightheadedness after about 6 months of being on it.   We had him resume this not because of the PE but because had known narrowing at places in his repaired aorta and great vessels based on repeated imaging for his endovascular grafts.  Severe narrowing where left innominate vein comes to confluence and some persistent thrombus in Left IJ.  R IJ occluded but fills  retrograde from vertebral.  Graft without leak.    He is back at work.  Intermittently feels he gets some swelling in face and neck as he was before.  Sometimes feels that both the left and right sides of his body go numb and resolves on own.  No SOB at rest or with ADLs or usual activity.  He can be SOB with carrying something heavy or bending over but overall better.  Climbing several flight of stairs as well.  When he saw us  many months ago was reporting episodes of SOB at rest and this is gone.  He went back to work full time in January of last year and initially for few months was diffiucult then gradually improved.  He has no 02 requirements.  No CPAP.  At the end of visit tells us  has had a few episodes of double vision.  Was having early on post his surgery and then seemed to resolve but now states in last several weeks has occurred again.  He cannot say related to anything he does.  He knows that if he closes one eye or other then his vision is okay.  Self  resolves after short time.  Has not had this looked into.  No other neurologic issue or headache etc at time. No injury.  Review of Systems As per history. Ten point review of systems filled out in clinic notable for fevers. Or sweats.  SOme ringing in ears, neck stiffness, dry mouth. Some atypical chest pressure. Swelling in neck. No longer complaining of palpitations.  Does have snoring.  No cough.  No hemoptysis. No daytime sleepiness or headaches.  No GI complaints.   Has joint pain everywhere.  Swelling in hands.  Numbness in lot of his body. No bleeding.    Current Outpatient Medications  Medication Sig  . apixaban (ELIQUIS) 5 mg tablet Take 1 tablet (5 mg total) by mouth every 12 (  twelve) hours Restarted todady  . aspirin 81 MG EC tablet Take 1 tablet (81 mg total) by mouth once daily  . losartan (COZAAR) 50 MG tablet Take 50 mg by mouth once daily  . metoprolol succinate (TOPROL-XL) 25 MG XL tablet Take 25 mg by mouth once daily   No current facility-administered medications for this visit.    Allergies  Allergen Reactions  . Codeine Vomiting  . Ciprofloxacin Other (See Comments)    Fluroquinolone antibiotics should be avoided in patients with history of aortic aneurysm/dissection   . Hydrocodone  Nausea and Vomiting    Social History:Works including some manual labor. Accompanied by his wife  Family History  Problem Relation Age of Onset  . Diabetes Mother   . Aneurysm Father     Physical Exam Vitals:   09/16/22 1028  BP: 125/79  Pulse: 72  Resp: 20  Temp: 36.1 C (97 F)  TempSrc: Oral  SpO2: 97%  Weight: 93 kg (205 lb 0.4 oz)  Height: 172.7 cm (5' 8)  PainSc: 0-No pain   General: This is a well appearing man of stated age in no apparent distress. NO 02  BMI 31 weight up 9 pounds from Dec SKIN:No rashes or lesions consistent with CTD. Healed scar around neck.  HEENT: Pupils equal and reactive to light, extraocular movements intact. No nystagmus.  Not currently  with double vision. FAce and neck do not appear swollen today NECK: JVP is not elevated. No thyromegaly  CV: Regular rate and rhythm, normal S1 + S2,  no diastolic or Systolic murmur. N0 RV lift. PULM: Few crackles at bases clear with couple deep breaths.  No wheeze or cough ABD: soft, + bowel sounds, nontender/nondistended. No hepatosplenomegaly. EXT: No lower extremity edema noted. Neuro:Alert, oriented, sensation intact on right hand but feels slightly different compared to left per pt.   Has been like this since surgery Psych: Appropriate mood and affect Musculoskeletal: Normal strength and tone.   Ancillary Data 6 minute walk performed on room air with resting sat 98% abd brieflly 96% but mostly 98%  HR 74 to 94.  Ambulates normal distance of 475 meters which is 89% predicted.    Echo with normal LV and RV size and function.  No significant valvular disease  Lab Results  Component Value Date   WBC 7.7 09/16/2022   HGB 15.2 09/16/2022   HCT 44.3 09/16/2022   PLT 201 09/16/2022   Lab Results  Component Value Date   NA 137 09/16/2022   K 4.4 09/16/2022   CL 103 09/16/2022   CO2 26 09/16/2022   BUN 11 09/16/2022   CREATININE 1.1 09/16/2022   GLUCOSE 100 09/16/2022   Lab Results  Component Value Date   AST 25 09/16/2022   ALT 24 09/16/2022   ALKPHOS 70 09/16/2022   TBILI 0.8 09/16/2022   CONJBILI 0.2 03/07/2021   ALB 3.9 09/16/2022   TOTALPROTEIN 6.9 09/16/2022    Lab Results  Component Value Date   PROBNP <36 09/16/2022   PROBNP 118 05/14/2021    No results for input(s): TEMPCORR, FIO2ART, PHART, PCO2ART, PO2ART, HCO3ART in the last 72 hours. Assessment & Plan MONTREY BUIST is a 62 y.o. 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  This was resolved by VQ.  Echo is normal as is walk test.  Still has abnormalities of venous drainage and arterial flow. We would recommend staying on  anticoagulation given the known  defects at least at prophylactic dose.  Not surprising that can get swelling at times.  I am not sure the the double vision is related.  Tried to CT of head and neck but unable to get and not informed of this until clinic over.  We have contacted him and referral to neurology to help sort this out.  Needs eye exam as well.  No issues related to His prior PE and heart and functional capacity is normal.   I spent a total of 30 minutes in both face-to-face and non-face-to-face activities, excluding procedures performed, for this visit on the date of this encounter. Reviewed work up of double vision etc post visit.

## 2022-09-24 IMAGING — CT CT L SPINE W/O CM
3 of 5 series · 14 of 33 positions shown, 16 images · non-contrast
Comparison: No prior lumbar spine CT, correlation is made with
lumbar spine radiographs 04/07/2007.

CLINICAL DATA: Lumbar radiculopathy, trauma, slipped and fell at
work



[Series 5: l spine soft · axial · 0.37mm/px · z∈[-914,-710]mm · 8 of 122 slices shown, 10 images]
[im 10/122  soft-tissue]
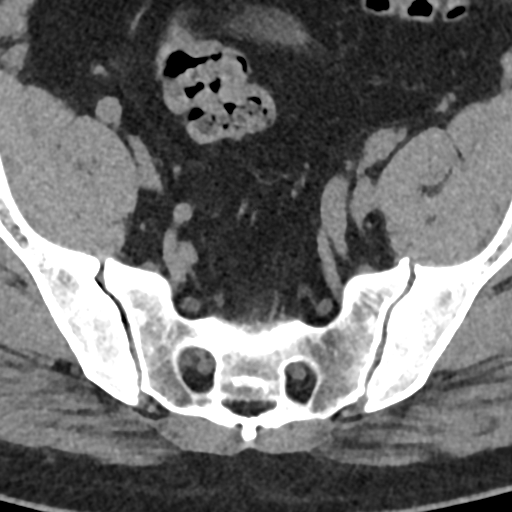
[im 10/122  bone]
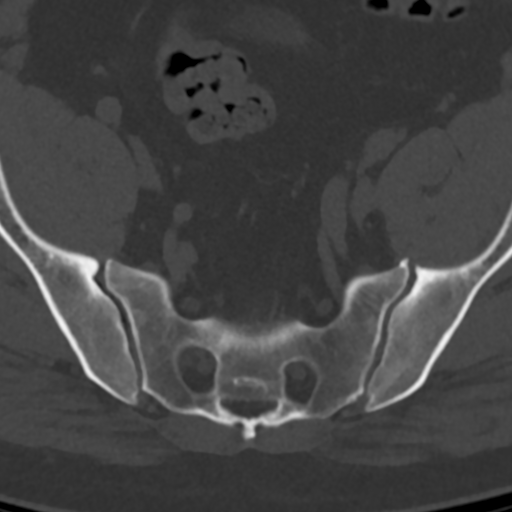
[im 28/122  bone]
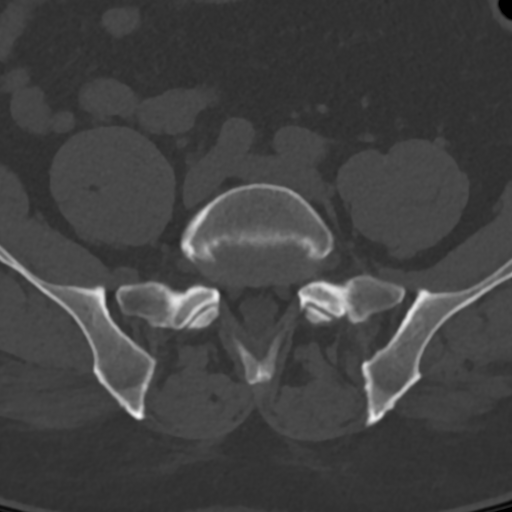
[im 38/122  bone]
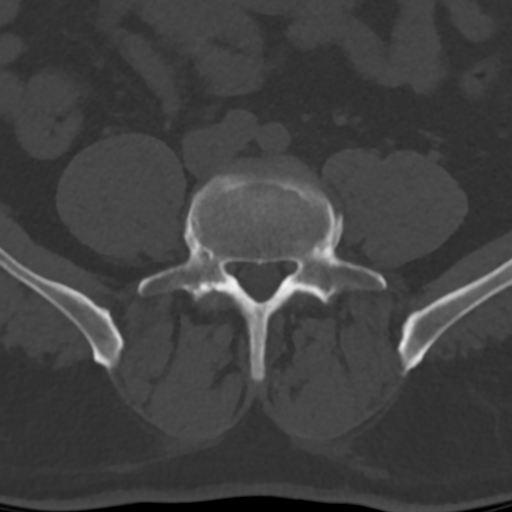
[im 56/122  bone]
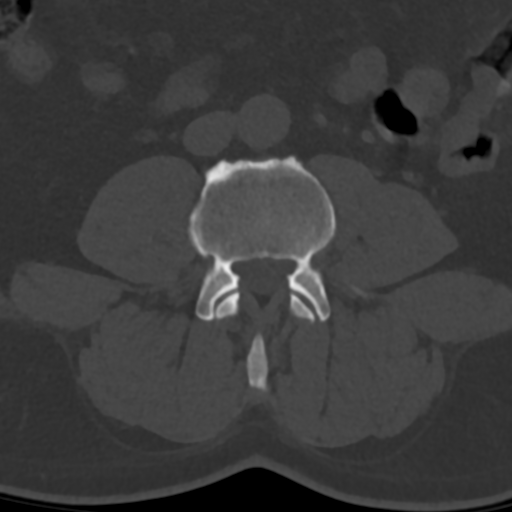
[im 66/122  soft-tissue]
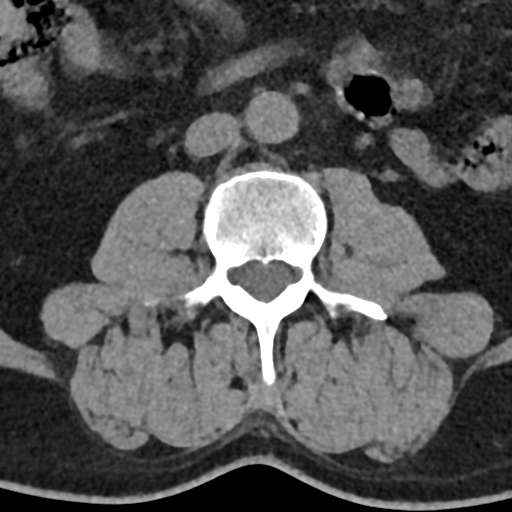
[im 66/122  bone]
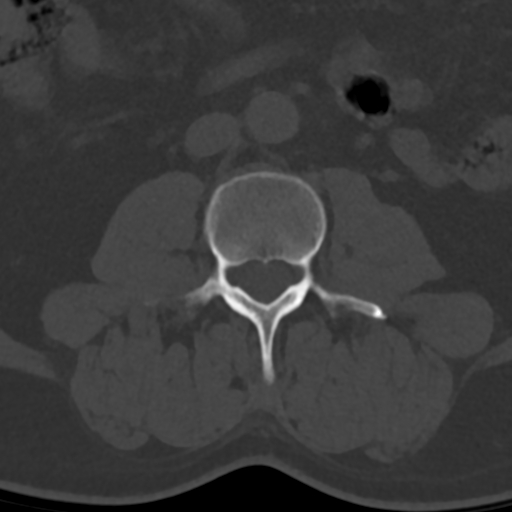
[im 84/122  bone]
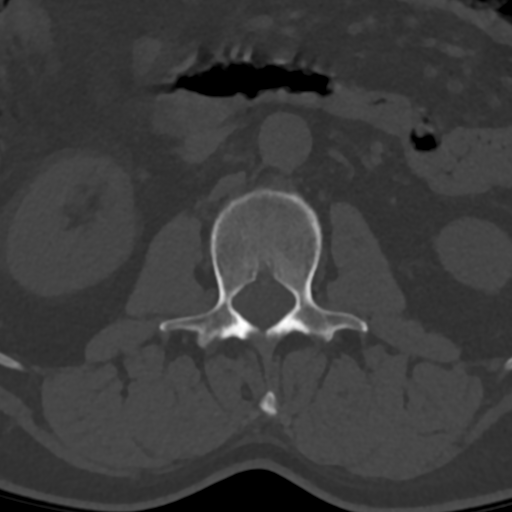
[im 94/122  bone]
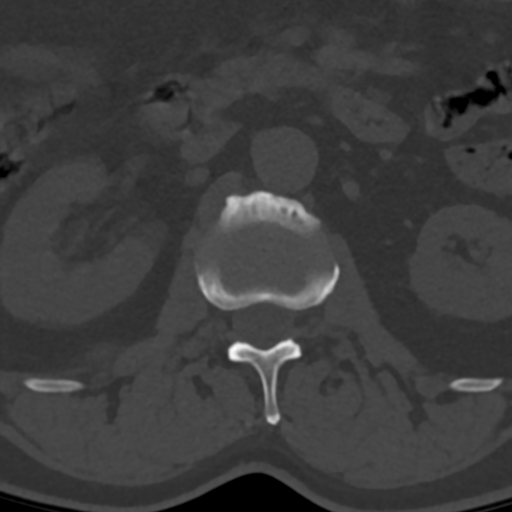
[im 112/122  bone]
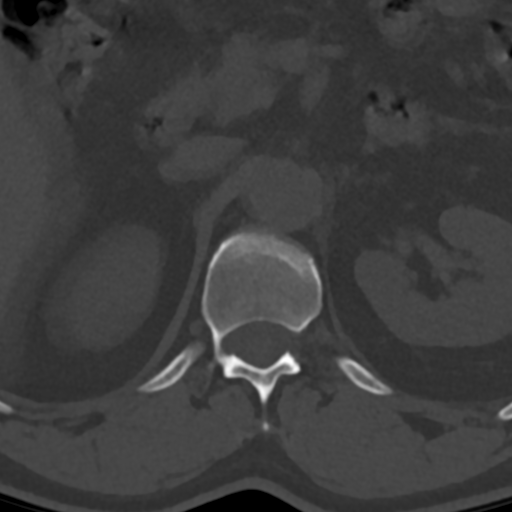

[Series 7: sag bone · sagittal · 0.37mm/px · 5 of 87 slices shown]
[im 15/87  bone]
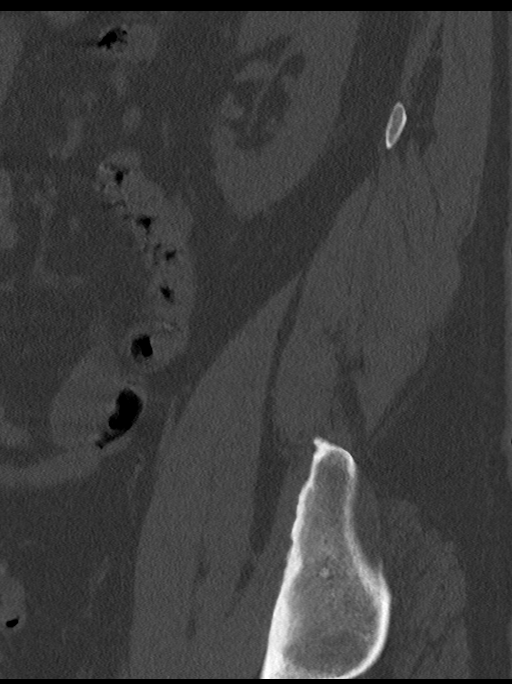
[im 29/87  bone]
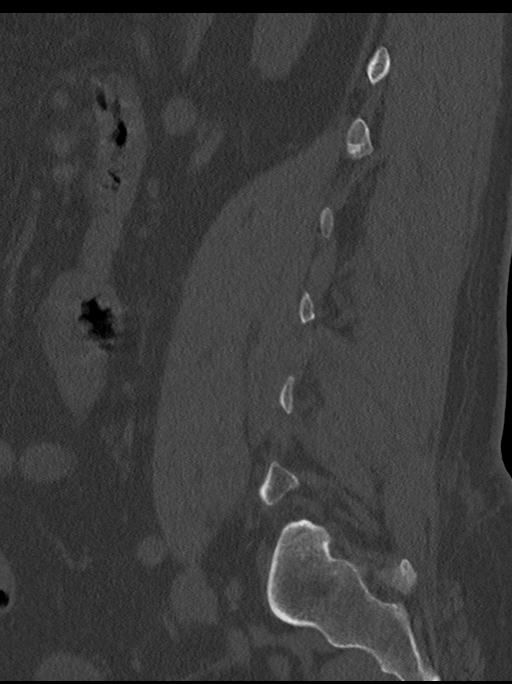
[im 44/87  bone]
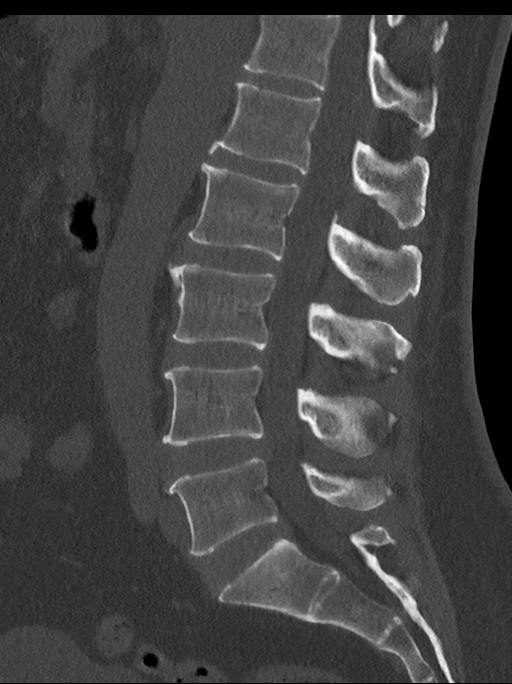
[im 58/87  bone]
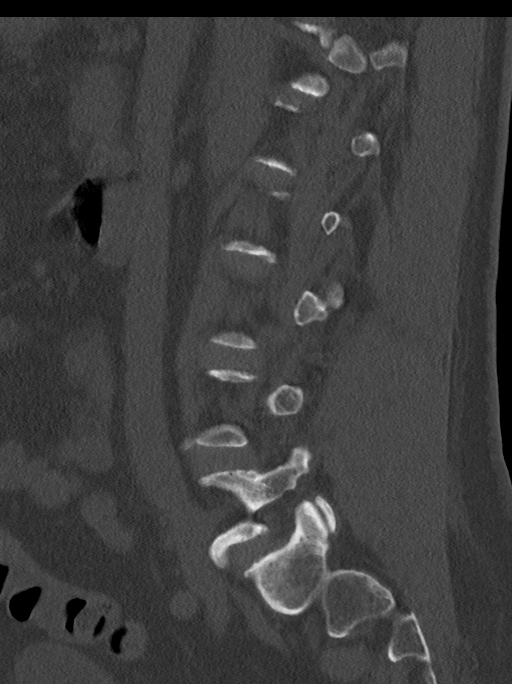
[im 72/87  bone]
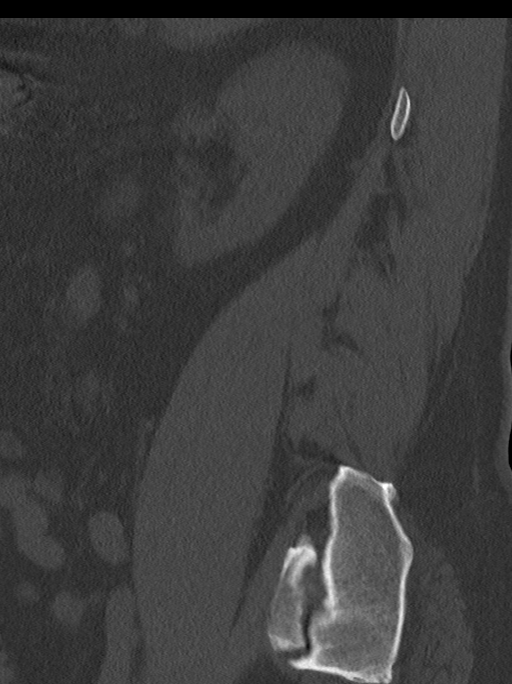

[Series 8: cor bone · coronal · 0.34mm/px · 1 of 95 slices shown]
[im 48/95  bone]
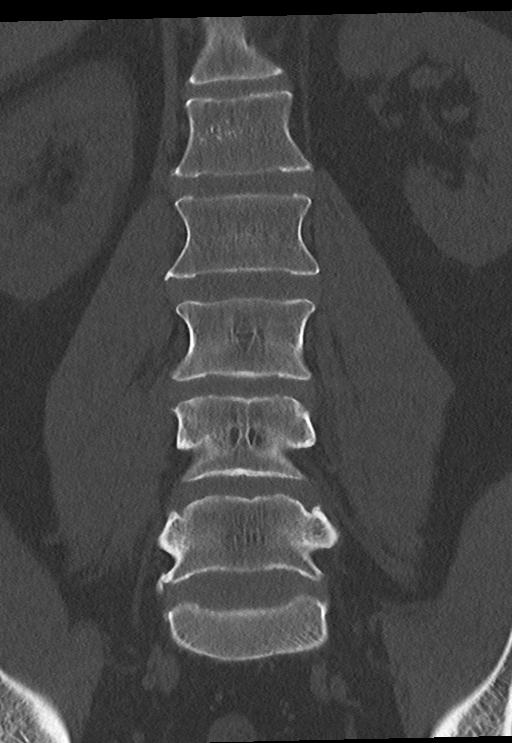

[14 of 33 positions shown; findings below may reference images not displayed]

FINDINGS: Segmentation: 5 lumbar type vertebrae.

Alignment: No listhesis.

Vertebrae: No acute fracture or suspicious osseous lesion.

Paraspinal and other soft tissues: Negative.

Disc levels:

T12-L1: No significant disc bulge. No spinal canal stenosis or
neural foraminal narrowing.

L1-L2: No significant disc bulge. No spinal canal stenosis or neural
foraminal narrowing.

L2-L3: Mild disc bulge. No spinal canal stenosis or neural foraminal
narrowing.

L3-L4: Mild disc bulge. No spinal canal stenosis or neural foraminal
narrowing.

L4-L5: No significant disc bulge. No spinal canal stenosis or neural
foraminal narrowing.

L5-S1: Mild disc bulge. No spinal canal stenosis or neural foraminal
narrowing.
IMPRESSION: No acute fracture. Mild degenerative changes without spinal canal
stenosis or significant neural foraminal narrowing.

## 2022-09-24 IMAGING — CT CT HIP*L* W/O CM
3 series · 15 of 33 positions shown, 18 images · non-contrast
Comparison: Hip film from earlier in the same day.

CLINICAL DATA: Fall 2 days ago with persistent hip pain, initial
encounter

EXAM:
CT OF THE LEFT HIP WITHOUT CONTRAST
TECHNIQUE: Multidetector CT imaging of the left hip was performed according to
the standard protocol. Multiplanar CT image reconstructions were
also generated.
RADIATION DOSE REDUCTION: This exam was performed according to the
departmental dose-optimization program which includes automated
exposure control, adjustment of the mA and/or kV according to
patient size and/or use of iterative reconstruction technique.

[Series 5: soft tissue · axial · 0.46mm/px · z∈[-1086,-920]mm · 7 of 99 slices shown, 9 images]
[im 8/99  soft-tissue]
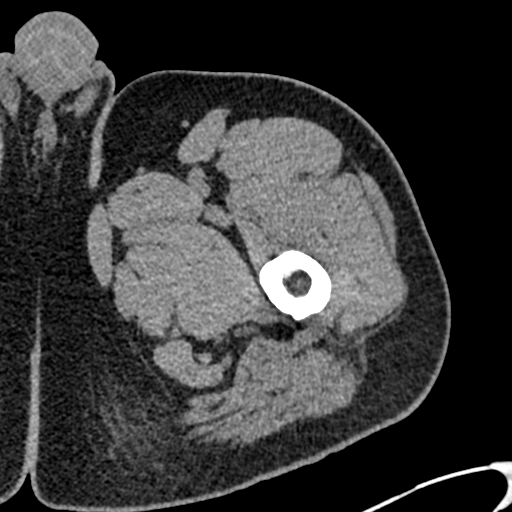
[im 8/99  bone]
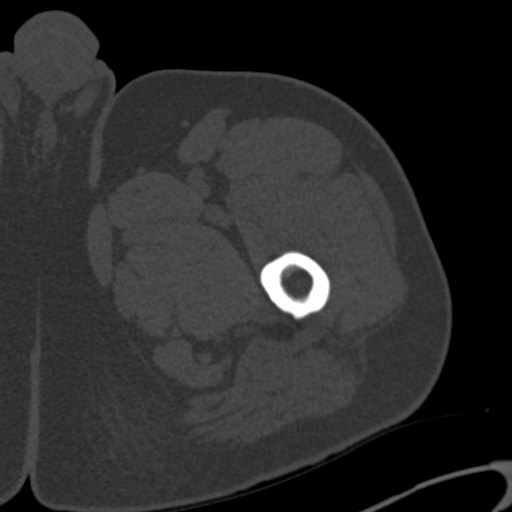
[im 23/99  bone]
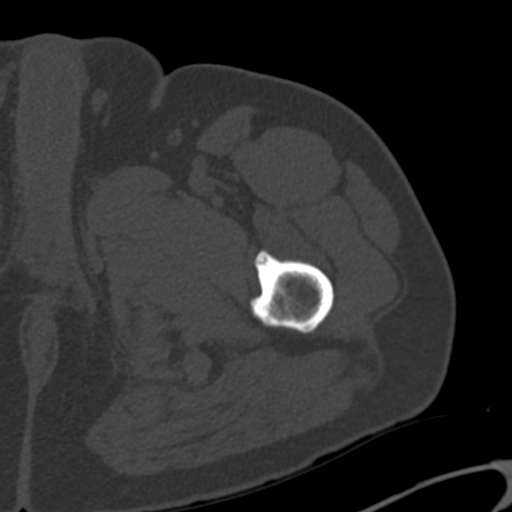
[im 38/99  bone]
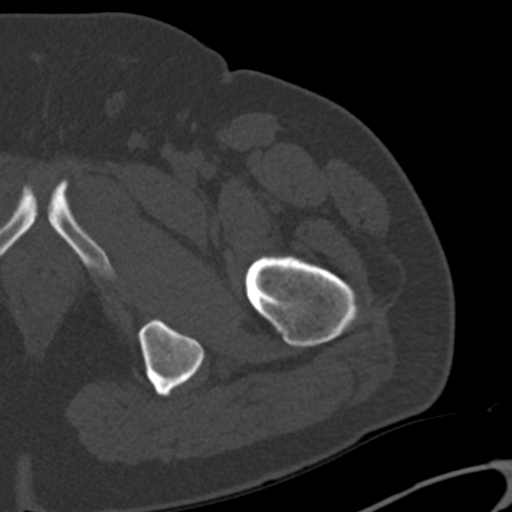
[im 53/99  bone]
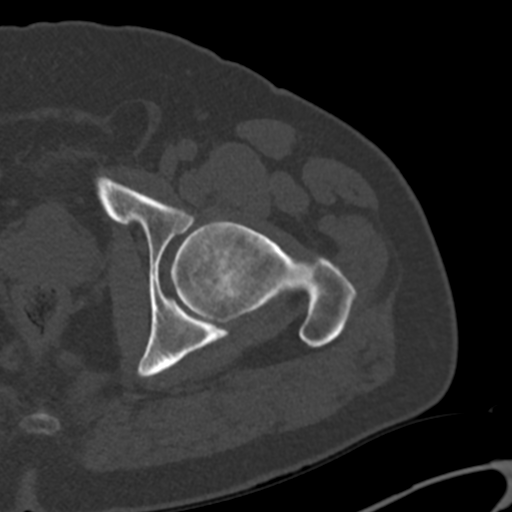
[im 61/99  soft-tissue]
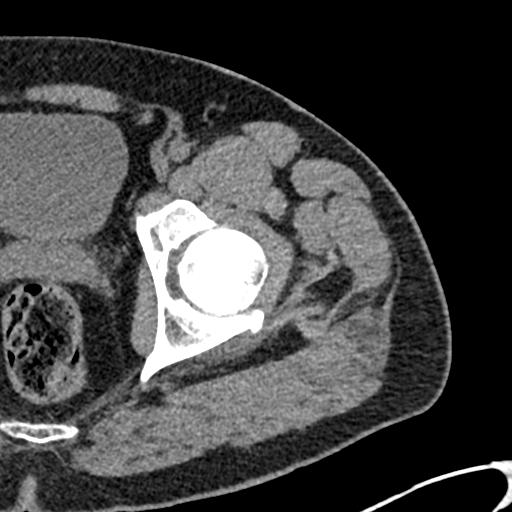
[im 61/99  bone]
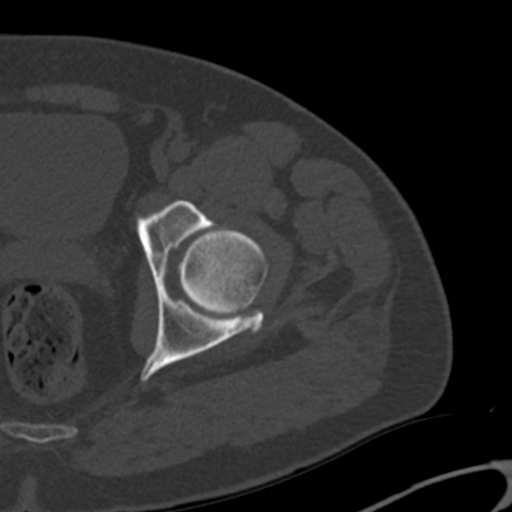
[im 76/99  bone]
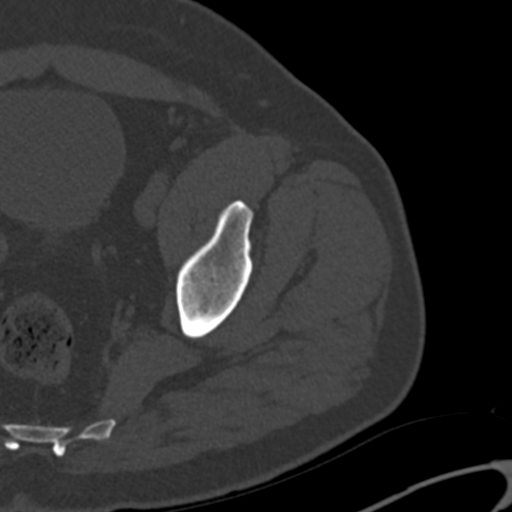
[im 91/99  bone]
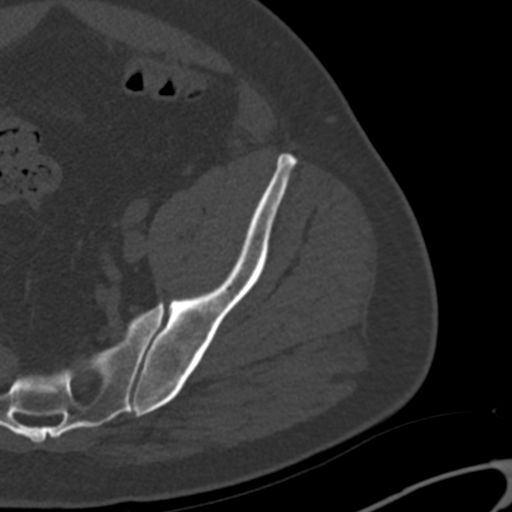

[Series 6: cor soft · coronal · 0.40mm/px · 3 of 110 slices shown]
[im 22/110  bone]
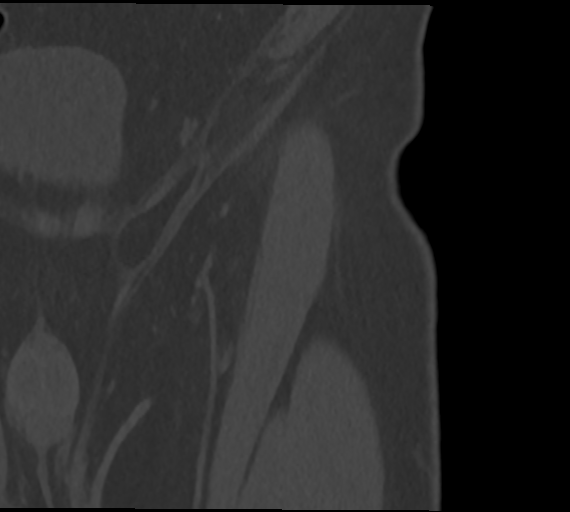
[im 44/110  bone]
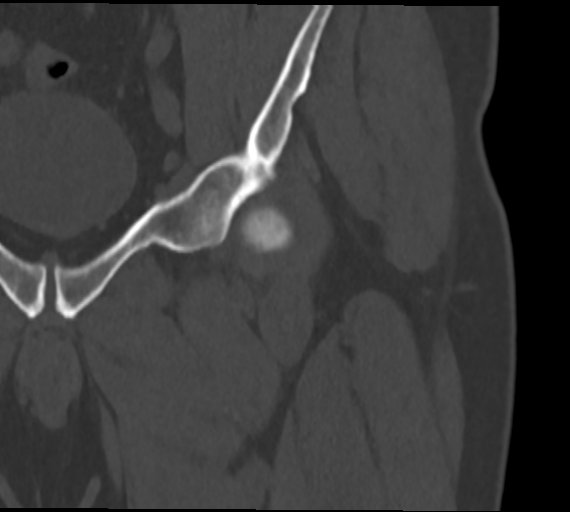
[im 66/110  bone]
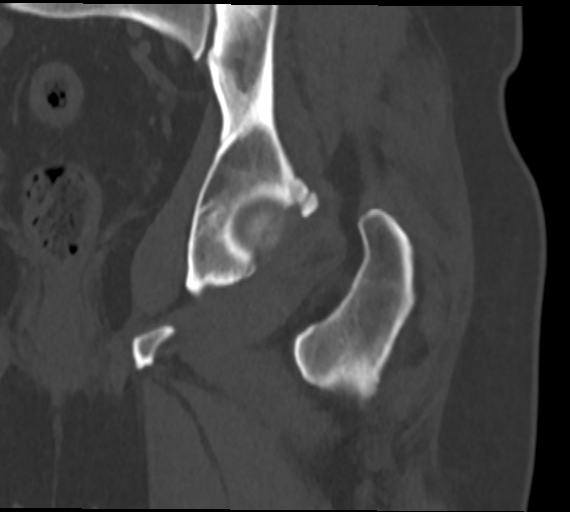

[Series 7: sag soft · sagittal · 0.40mm/px · 5 of 114 slices shown, 6 images]
[im 38/114  bone]
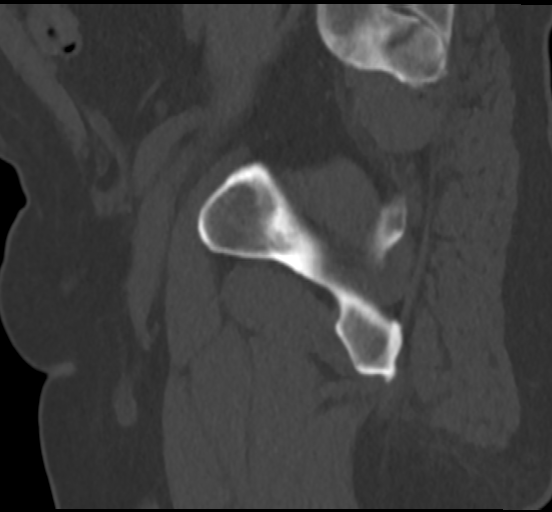
[im 48/114  bone]
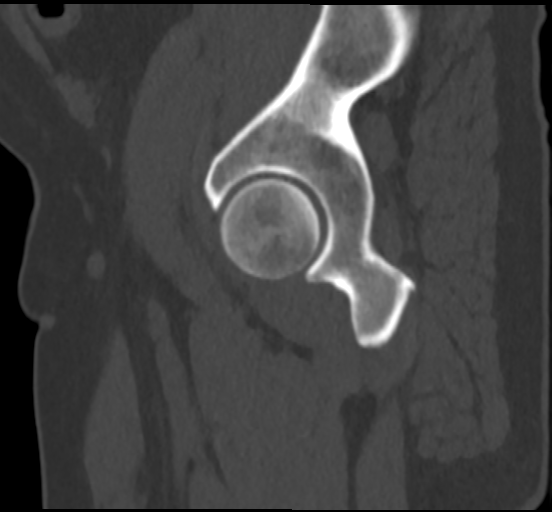
[im 57/114  soft-tissue]
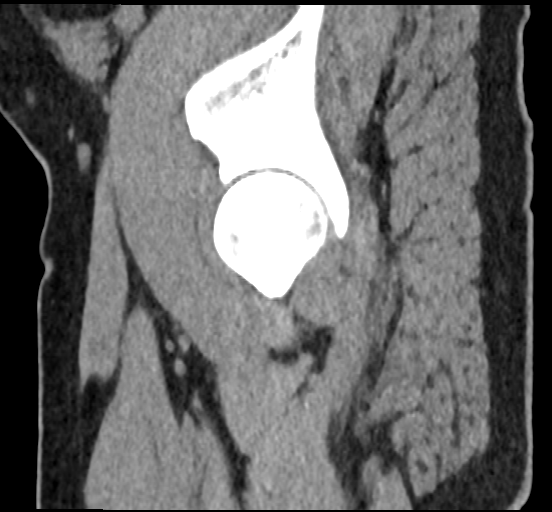
[im 57/114  bone]
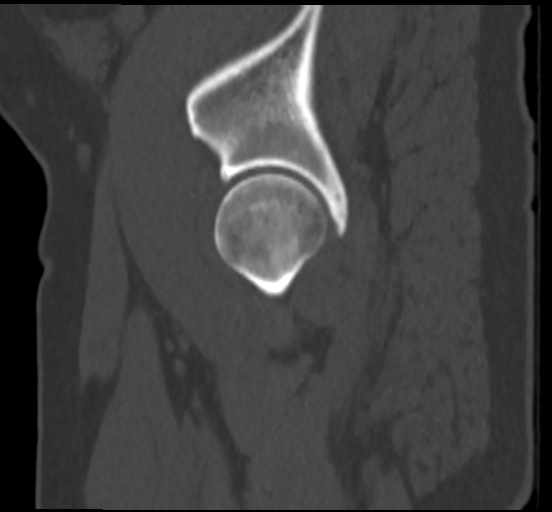
[im 66/114  bone]
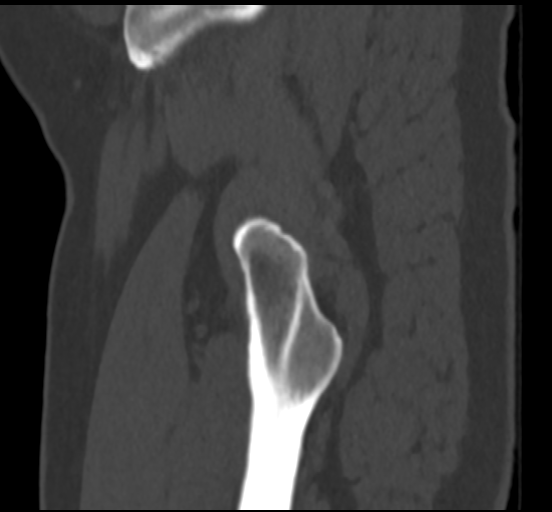
[im 76/114  bone]
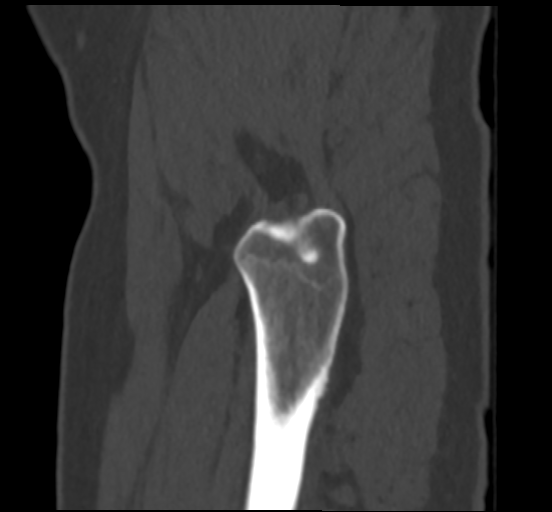

[15 of 33 positions shown; findings below may reference images not displayed]

FINDINGS: Bones/Joint/Cartilage

No acute fracture or dislocation is noted. Well corticated bony
density is noted along the posterior aspect of the left acetabulum
similar to that seen on prior plain film examination likely related
to prior trauma and nonunion. No joint effusion to suggest acute
abnormality is seen. No other fractures are noted.

Ligaments

Suboptimally assessed by CT.

Muscles and Tendons

Surrounding musculature appears within normal limits.

Soft tissues

Minimal soft tissue inflammatory changes noted along the lateral
aspect of the hip consistent with the recent injury. No focal
hematoma is noted.
IMPRESSION: Changes consistent with prior posterior acetabular fracture with
nonunion.

No acute fracture noted.

## 2024-07-05 ENCOUNTER — Encounter (INDEPENDENT_AMBULATORY_CARE_PROVIDER_SITE_OTHER): Payer: Self-pay | Admitting: *Deleted
